# Patient Record
Sex: Female | Born: 2000 | Race: White | Hispanic: No | Marital: Single | Smoking: Former smoker
Health system: Southern US, Community
[De-identification: ages and names within clinical notes are randomized; demographics above are authoritative.]

## PROBLEM LIST (undated history)

## (undated) DIAGNOSIS — F329 Major depressive disorder, single episode, unspecified: Secondary | ICD-10-CM

## (undated) DIAGNOSIS — J020 Streptococcal pharyngitis: Secondary | ICD-10-CM

## (undated) DIAGNOSIS — F32A Depression, unspecified: Secondary | ICD-10-CM

## (undated) DIAGNOSIS — Z789 Other specified health status: Secondary | ICD-10-CM

## (undated) HISTORY — PX: WISDOM TOOTH EXTRACTION: SHX21

---
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
## 2018-05-05 ENCOUNTER — Emergency Department (HOSPITAL_COMMUNITY)
Admission: EM | Admit: 2018-05-05 | Discharge: 2018-05-05 | Disposition: A | Discharge status: Home / Self Care | Payer: Medicaid Other | Attending: Emergency Medicine | Admitting: Emergency Medicine

## 2018-05-05 ENCOUNTER — Encounter (HOSPITAL_COMMUNITY): Payer: Self-pay | Admitting: Emergency Medicine

## 2018-05-05 ENCOUNTER — Other Ambulatory Visit: Payer: Self-pay

## 2018-05-05 DIAGNOSIS — R509 Fever, unspecified: Secondary | ICD-10-CM | POA: Diagnosis present

## 2018-05-05 DIAGNOSIS — R110 Nausea: Secondary | ICD-10-CM | POA: Diagnosis not present

## 2018-05-05 DIAGNOSIS — R109 Unspecified abdominal pain: Secondary | ICD-10-CM | POA: Diagnosis not present

## 2018-05-05 DIAGNOSIS — Z5321 Procedure and treatment not carried out due to patient leaving prior to being seen by health care provider: Secondary | ICD-10-CM | POA: Diagnosis not present

## 2018-05-05 DIAGNOSIS — R51 Headache: Secondary | ICD-10-CM | POA: Insufficient documentation

## 2018-05-05 DIAGNOSIS — Z5329 Procedure and treatment not carried out because of patient's decision for other reasons: Secondary | ICD-10-CM

## 2018-05-05 DIAGNOSIS — Z5320 Procedure and treatment not carried out because of patient's decision for unspecified reasons: Secondary | ICD-10-CM

## 2018-05-05 HISTORY — DX: Depression, unspecified: F32.A

## 2018-05-05 HISTORY — DX: Major depressive disorder, single episode, unspecified: F32.9

## 2018-05-05 LAB — COMPREHENSIVE METABOLIC PANEL
ALT: 8 U/L (ref 0–44)
AST: 13 U/L — ABNORMAL LOW (ref 15–41)
Albumin: 4 g/dL (ref 3.5–5.0)
Alkaline Phosphatase: 50 U/L (ref 38–126)
Anion gap: 11 (ref 5–15)
BUN: <5 mg/dL — ABNORMAL LOW (ref 6–20)
CHLORIDE: 102 mmol/L (ref 98–111)
CO2: 25 mmol/L (ref 22–32)
CREATININE: 0.78 mg/dL (ref 0.44–1.00)
Calcium: 9.7 mg/dL (ref 8.9–10.3)
Glucose, Bld: 100 mg/dL — ABNORMAL HIGH (ref 70–99)
POTASSIUM: 3.8 mmol/L (ref 3.5–5.1)
SODIUM: 138 mmol/L (ref 135–145)
Total Bilirubin: 0.8 mg/dL (ref 0.3–1.2)
Total Protein: 7.5 g/dL (ref 6.5–8.1)

## 2018-05-05 LAB — URINALYSIS, ROUTINE W REFLEX MICROSCOPIC
BILIRUBIN URINE: NEGATIVE
GLUCOSE, UA: NEGATIVE mg/dL
Ketones, ur: 5 mg/dL — AB
LEUKOCYTES UA: NEGATIVE
NITRITE: NEGATIVE
PH: 6 (ref 5.0–8.0)
Protein, ur: NEGATIVE mg/dL
Specific Gravity, Urine: 1.017 (ref 1.005–1.030)

## 2018-05-05 LAB — CBC WITH DIFFERENTIAL/PLATELET
ABS IMMATURE GRANULOCYTES: 0 10*3/uL (ref 0.0–0.1)
Basophils Absolute: 0 10*3/uL (ref 0.0–0.1)
Basophils Relative: 0 %
EOS ABS: 0.1 10*3/uL (ref 0.0–0.7)
Eosinophils Relative: 1 %
HEMATOCRIT: 44.7 % (ref 36.0–46.0)
HEMOGLOBIN: 14.7 g/dL (ref 12.0–15.0)
IMMATURE GRANULOCYTES: 0 %
LYMPHS ABS: 1 10*3/uL (ref 0.7–4.0)
LYMPHS PCT: 9 %
MCH: 29.5 pg (ref 26.0–34.0)
MCHC: 32.9 g/dL (ref 30.0–36.0)
MCV: 89.8 fL (ref 78.0–100.0)
MONOS PCT: 7 %
Monocytes Absolute: 0.8 10*3/uL (ref 0.1–1.0)
NEUTROS ABS: 8.7 10*3/uL — AB (ref 1.7–7.7)
NEUTROS PCT: 83 %
Platelets: 283 10*3/uL (ref 150–400)
RBC: 4.98 MIL/uL (ref 3.87–5.11)
RDW: 12 % (ref 11.5–15.5)
WBC: 10.6 10*3/uL — AB (ref 4.0–10.5)

## 2018-05-05 LAB — I-STAT BETA HCG BLOOD, ED (MC, WL, AP ONLY): I-stat hCG, quantitative: <5 m[IU]/mL (ref <5)

## 2018-05-05 LAB — I-STAT CG4 LACTIC ACID, ED: LACTIC ACID, VENOUS: 0.89 mmol/L (ref 0.5–1.9)

## 2018-05-05 LAB — LIPASE, BLOOD: LIPASE: 30 U/L (ref 11–51)

## 2018-05-05 MED ORDER — ACETAMINOPHEN 500 MG PO TABS
1000.0000 mg | ORAL_TABLET | Freq: Once | ORAL | Status: AC
  Administered 2018-05-05: 1000 mg via ORAL
  Filled 2018-05-05: qty 2

## 2018-05-05 MED ORDER — IOPAMIDOL (ISOVUE-300) INJECTION 61%
INTRAVENOUS | Status: AC
  Filled 2018-05-05: qty 100

## 2018-05-05 NOTE — ED Triage Notes (Signed)
Patient reports fever, chills and right sided pain since Tuesday night. Took tylenol and Excedrin yesterday.  Lack of appetite. Denies any urinary symptoms.

## 2018-05-05 NOTE — ED Notes (Addendum)
Pt's mom called asking to speak to nursing supervisor.  Mom was transferred to this RN at [facility] and I spoke with her for approx 15 min..  Mom is concerned over wait for treatment room.  Explained wait and triage process.  Mom wanting lab results.  Explained that I cannot give her pt information without speaking with pt and that I can not give results to her or to patient that doctor or PA will have to give her results.  She is concerned that someone told her "everything was not ordered" when pt was initially seen.  Unable to locate pt in waiting room as she said earlier she was stepping outside.  Mom said she would text her to come talk to me.  Pt back to [facility] desk and is calm and appropriate.  Does not appear anxious.  She gave permission for me to talk to mom.  Informed mom that labs were ordered and completed and that pt is waiting on CT.  Mom wants to know how much longer for CT and treatment room.  Explained triage process multiple times and wait for treatment room.  Explained delay in CT due to Code Stroke and Trauma patients.  Mom keeps stating, "If y'all can't handle the volume you should send the patients somewhere else. Just tell me she needs to go somewhere else."  Explained to her that we cannot turn patients away and that we encouraged pt to stay to be seen.  She states that pt is extremely anxious.  Informed her that pt is in front of me and is in no distress at this time.  After getting off phone with patient's mother pt asked for her IV to be removed so she could go home.  Encouraged her again to stay and she declines.

## 2018-05-05 NOTE — ED Notes (Signed)
Pt wanting to leave.  Encouraged pt to stay to be seen and explained triage process.

## 2018-05-05 NOTE — ED Provider Notes (Cosign Needed)
Asked to reassess patient.  Last saw her an hour ago.  Her HR is down and fever is down with out tylenol (which was just given.)  She is waiting on CMP so she can have her CT scan.  Denies feeling any different or new complaints.    Cristina Gong, [place] 05/05/18 2030

## 2018-05-05 NOTE — ED Provider Notes (Cosign Needed)
Patient placed in Quick Look pathway, seen and evaluated   Chief Complaint: Fever  HPI:   Nausea with out vomiting.  Right side abdominal pain.  She has had fever with headache, denies any neck pain or stiffness.  The abdominal pain started today.  Denies any urinary symptoms.  Is never had any abdominal surgeries.   ROS: Fever, no cough  Physical Exam:   Gen: No distress  Neuro: Awake and Alert  Skin: Warm    Focused Exam: Throat no tonsillar exudates.  Abdomen is tender in the right upper and right lower quadrant.    Repeat heart rate will patient allowed to sit in the room heart rate down into the 80s / 90, no longer currently meeting sepsis criteria, therefore sepsis orders not placed.   Initiation of care has begun. The patient has been counseled on the process, plan, and necessity for staying for the completion/evaluation, and the remainder of the medical screening examination    Cristina Gong, Cordelia [place] 05/05/18 1927

## 2018-05-16 ENCOUNTER — Encounter (HOSPITAL_BASED_OUTPATIENT_CLINIC_OR_DEPARTMENT_OTHER): Payer: Self-pay

## 2018-09-06 ENCOUNTER — Other Ambulatory Visit: Payer: Self-pay

## 2018-09-06 ENCOUNTER — Emergency Department (HOSPITAL_BASED_OUTPATIENT_CLINIC_OR_DEPARTMENT_OTHER): Payer: Medicaid Other

## 2018-09-06 ENCOUNTER — Emergency Department (HOSPITAL_BASED_OUTPATIENT_CLINIC_OR_DEPARTMENT_OTHER)
Admission: EM | Admit: 2018-09-06 | Discharge: 2018-09-06 | Disposition: A | Discharge status: Home / Self Care | Payer: Medicaid Other | Attending: Emergency Medicine | Admitting: Emergency Medicine

## 2018-09-06 ENCOUNTER — Encounter (HOSPITAL_BASED_OUTPATIENT_CLINIC_OR_DEPARTMENT_OTHER): Payer: Self-pay

## 2018-09-06 DIAGNOSIS — R109 Unspecified abdominal pain: Secondary | ICD-10-CM | POA: Insufficient documentation

## 2018-09-06 DIAGNOSIS — R110 Nausea: Secondary | ICD-10-CM | POA: Diagnosis not present

## 2018-09-06 DIAGNOSIS — F17200 Nicotine dependence, unspecified, uncomplicated: Secondary | ICD-10-CM | POA: Diagnosis not present

## 2018-09-06 DIAGNOSIS — R1032 Left lower quadrant pain: Secondary | ICD-10-CM | POA: Diagnosis not present

## 2018-09-06 DIAGNOSIS — F1210 Cannabis abuse, uncomplicated: Secondary | ICD-10-CM | POA: Insufficient documentation

## 2018-09-06 LAB — COMPREHENSIVE METABOLIC PANEL
ALT: 12 U/L (ref 0–44)
AST: 16 U/L (ref 15–41)
Albumin: 4.8 g/dL (ref 3.5–5.0)
Alkaline Phosphatase: 53 U/L (ref 38–126)
Anion gap: 9 (ref 5–15)
BUN: 7 mg/dL (ref 6–20)
CHLORIDE: 103 mmol/L (ref 98–111)
CO2: 24 mmol/L (ref 22–32)
Calcium: 9.5 mg/dL (ref 8.9–10.3)
Creatinine, Ser: 0.8 mg/dL (ref 0.44–1.00)
Glucose, Bld: 108 mg/dL — ABNORMAL HIGH (ref 70–99)
POTASSIUM: 3.5 mmol/L (ref 3.5–5.1)
SODIUM: 136 mmol/L (ref 135–145)
Total Bilirubin: 1.2 mg/dL (ref 0.3–1.2)
Total Protein: 7.9 g/dL (ref 6.5–8.1)

## 2018-09-06 LAB — URINALYSIS, ROUTINE W REFLEX MICROSCOPIC
Bilirubin Urine: NEGATIVE
Glucose, UA: NEGATIVE mg/dL
Ketones, ur: 15 mg/dL — AB
Nitrite: NEGATIVE
Protein, ur: 30 mg/dL — AB
Specific Gravity, Urine: 1.02 (ref 1.005–1.030)
pH: 6 (ref 5.0–8.0)

## 2018-09-06 LAB — URINALYSIS, MICROSCOPIC (REFLEX)

## 2018-09-06 LAB — CBC
HCT: 46 % (ref 36.0–46.0)
Hemoglobin: 14.8 g/dL (ref 12.0–15.0)
MCH: 29.2 pg (ref 26.0–34.0)
MCHC: 32.2 g/dL (ref 30.0–36.0)
MCV: 90.9 fL (ref 80.0–100.0)
NRBC: 0 % (ref 0.0–0.2)
PLATELETS: 303 10*3/uL (ref 150–400)
RBC: 5.06 MIL/uL (ref 3.87–5.11)
RDW: 13 % (ref 11.5–15.5)
WBC: 12.2 10*3/uL — ABNORMAL HIGH (ref 4.0–10.5)

## 2018-09-06 LAB — PREGNANCY, URINE: PREG TEST UR: NEGATIVE

## 2018-09-06 MED ORDER — KETOROLAC TROMETHAMINE 30 MG/ML IJ SOLN
30.0000 mg | Freq: Once | INTRAMUSCULAR | Status: AC
  Administered 2018-09-06: 30 mg via INTRAVENOUS
  Filled 2018-09-06: qty 1

## 2018-09-06 MED ORDER — ONDANSETRON HCL 4 MG/2ML IJ SOLN
4.0000 mg | Freq: Once | INTRAMUSCULAR | Status: AC
  Administered 2018-09-06: 4 mg via INTRAVENOUS
  Filled 2018-09-06: qty 2

## 2018-09-06 MED ORDER — CIPROFLOXACIN HCL 500 MG PO TABS: 500.0000 mg | ORAL_TABLET | Freq: Two times a day (BID) | ORAL | 0 refills | Status: DC

## 2018-09-06 MED ORDER — SODIUM CHLORIDE 0.9 % IV BOLUS
500.0000 mL | Freq: Once | INTRAVENOUS | Status: AC
  Administered 2018-09-06: 500 mL via INTRAVENOUS

## 2018-09-06 MED ORDER — OXYCODONE-ACETAMINOPHEN 5-325 MG PO TABS
1.0000 | ORAL_TABLET | Freq: Once | ORAL | Status: AC
  Administered 2018-09-06: 1 via ORAL
  Filled 2018-09-06: qty 1

## 2018-09-06 MED ORDER — CIPROFLOXACIN HCL 500 MG PO TABS
500.0000 mg | ORAL_TABLET | Freq: Once | ORAL | Status: AC
  Administered 2018-09-06: 500 mg via ORAL
  Filled 2018-09-06: qty 1

## 2018-09-06 MED ORDER — ONDANSETRON 4 MG PO TBDP: 4.0000 mg | ORAL_TABLET | Freq: Three times a day (TID) | ORAL | 0 refills | Status: DC | PRN

## 2018-09-06 MED ORDER — METRONIDAZOLE 500 MG PO TABS: 500.0000 mg | ORAL_TABLET | Freq: Three times a day (TID) | ORAL | 0 refills | Status: DC

## 2018-09-06 MED ORDER — MORPHINE SULFATE (PF) 4 MG/ML IV SOLN
4.0000 mg | Freq: Once | INTRAVENOUS | Status: AC
  Administered 2018-09-06: 4 mg via INTRAVENOUS
  Filled 2018-09-06: qty 1

## 2018-09-06 MED ORDER — NAPROXEN 500 MG PO TABS: 500.0000 mg | ORAL_TABLET | Freq: Two times a day (BID) | ORAL | 0 refills | Status: DC

## 2018-09-06 MED ORDER — METRONIDAZOLE IN NACL 5-0.79 MG/ML-% IV SOLN
500.0000 mg | Freq: Once | INTRAVENOUS | Status: AC
  Administered 2018-09-06: 500 mg via INTRAVENOUS
  Filled 2018-09-06: qty 100

## 2018-09-06 MED ORDER — SODIUM CHLORIDE 0.9 % IV SOLN
INTRAVENOUS | Status: DC | PRN
  Administered 2018-09-06: 14:00:00 via INTRAVENOUS

## 2018-09-06 MED ORDER — CIPROFLOXACIN IN D5W 400 MG/200ML IV SOLN
400.0000 mg | Freq: Once | INTRAVENOUS | Status: DC
  Filled 2018-09-06: qty 200

## 2018-09-06 MED ORDER — OXYCODONE-ACETAMINOPHEN 5-325 MG PO TABS: 1.0000 | ORAL_TABLET | Freq: Four times a day (QID) | ORAL | 0 refills | Status: DC | PRN

## 2018-09-06 MED ORDER — METRONIDAZOLE IN NACL 5-0.79 MG/ML-% IV SOLN
INTRAVENOUS | Status: AC
  Administered 2018-09-06: 500 mg via INTRAVENOUS
  Filled 2018-09-06: qty 100

## 2018-09-06 NOTE — ED Triage Notes (Signed)
C/o LLQ pain, nausea started last night-NAD-steady gait

## 2018-09-06 NOTE — ED Provider Notes (Signed)
[facility] EMERGENCY DEPARTMENT Provider Note   CSN: [id number] Arrival date & time: 09/06/18  1130     History   Chief Complaint Chief Complaint  Patient presents with  . Abdominal Pain    HPI Danielle Ortiz is a 18 y.o. female with a hx of tobacco abuse and depression who presents to the ED with complaints of L flank pain that woke her from sleep last night. States pain is located in the L flank and somewhat radiates to the LLQ. Pain waxes/wanes and is a 6/10 in severity, worse with "everything" no alleviating factors. Tried aspirin without relief. Has had some associated nausea. Denies fever, chills, vomiting, dysuria, frequency, urgency, hematuria, vaginal bleeding, or vaginal discharge. She is sexually active in a monogamous relationship and is not concerned for STDs.   HPI  Past Medical History:  Diagnosis Date  . Depression   . Medical history non-contributory   . Strep throat     Patient Active Problem List   Diagnosis Date Noted  . Occipital neuralgia of right side 10/14/2016    Past Surgical History:  Procedure Laterality Date  . WISDOM TOOTH EXTRACTION       OB History    Gravida  1   Para  0   Term  0   Preterm  0   AB  0   Living        SAB  0   TAB  0   Ectopic  0   Multiple      Live Births               Home Medications    Prior to Admission medications   Medication Sig Start Date End Date Taking? Authorizing Provider  ADDERALL XR 10 MG 24 hr capsule Take 10 mg by mouth daily. 04/29/18   [provider]  aspirin-acetaminophen-caffeine (EXCEDRIN MIGRAINE) 6360082725250-250-65 MG tablet Take 2 tablets by mouth every 6 (six) hours as needed for headache.    [provider]  FLUoxetine (PROZAC) 10 MG capsule Take 10 mg by mouth daily. 04/28/18   [provider]    Family History Family History  Problem Relation Age of Onset  . Migraines Mother   . Migraines Maternal Aunt   . Migraines  Maternal Grandmother   . Depression Maternal Grandmother   . Seizures Neg Hx   . Anxiety disorder Neg Hx   . Bipolar disorder Neg Hx   . Schizophrenia Neg Hx   . ADD / ADHD Neg Hx   . Autism Neg Hx     Social History Social History   Tobacco Use  . Smoking status: Current Every Day Smoker  . Smokeless tobacco: Never Used  Substance Use Topics  . Alcohol use: Yes    Comment: occ  . Drug use: Yes    Types: Marijuana     Allergies   Patient has no known allergies.   Review of Systems Review of Systems  Constitutional: Negative for chills and fever.  Respiratory: Negative for shortness of breath.   Cardiovascular: Negative for chest pain.  Gastrointestinal: Positive for abdominal pain and nausea. Negative for blood in stool, constipation, diarrhea and vomiting.  Genitourinary: Positive for flank pain. Negative for decreased urine volume, dysuria, hematuria, pelvic pain, urgency, vaginal bleeding and vaginal discharge.  All other systems reviewed and are negative.    Physical Exam Updated Vital Signs BP 112/74 (BP Location: Left Arm)   Pulse 87   Temp 98.8  F (37.1 C) (Oral)   Resp 18   Ht 5\' 3"  (1.6 m)   Wt 56.8 kg   SpO2 100%   BMI 22.20 kg/m   Physical Exam Vitals signs and nursing note reviewed.  Constitutional:      General: She is not in acute distress.    Appearance: She is well-developed. She is not toxic-appearing.  HENT:     Head: Normocephalic and atraumatic.  Eyes:     General:        Right eye: No discharge.        Left eye: No discharge.     Conjunctiva/sclera: Conjunctivae normal.  Neck:     Musculoskeletal: Neck supple.  Cardiovascular:     Rate and Rhythm: Normal rate and regular rhythm.  Pulmonary:     Effort: Pulmonary effort is normal. No respiratory distress.     Breath sounds: Normal breath sounds. No wheezing, rhonchi or rales.  Abdominal:     General: There is no distension.     Palpations: Abdomen is soft.     Tenderness:  There is no abdominal tenderness. There is left CVA tenderness. There is no guarding or rebound. Negative signs include Murphy's sign, Rovsing's sign and McBurney's sign.  Skin:    General: Skin is warm and dry.     Findings: No rash.  Neurological:     Mental Status: She is alert.     Comments: Clear speech.   Psychiatric:        Behavior: Behavior normal.      ED Treatments / Results  Labs (all labs ordered are listed, but only abnormal results are displayed) Labs Reviewed  URINALYSIS, ROUTINE W REFLEX MICROSCOPIC - Abnormal; Notable for the following components:      Result Value   Hgb urine dipstick SMALL (*)    Ketones, ur 15 (*)    Protein, ur 30 (*)    Leukocytes, UA SMALL (*)    All other components within normal limits  CBC - Abnormal; Notable for the following components:   WBC 12.2 (*)    All other components within normal limits  COMPREHENSIVE METABOLIC PANEL - Abnormal; Notable for the following components:   Glucose, Bld 108 (*)    All other components within normal limits  URINALYSIS, MICROSCOPIC (REFLEX) - Abnormal; Notable for the following components:   Bacteria, UA MANY (*)    All other components within normal limits  URINE CULTURE  PREGNANCY, URINE    EKG None  Radiology Ct Renal Stone Study  Result Date: 09/06/2018 CLINICAL DATA:  Left lower quadrant abdominal pain and nausea since last night. EXAM: CT ABDOMEN AND PELVIS WITHOUT CONTRAST TECHNIQUE: Multidetector CT imaging of the abdomen and pelvis was performed following the standard protocol without IV contrast. COMPARISON:  None. FINDINGS: Lower chest: Clear lung bases. No significant pleural or pericardial effusion. Hepatobiliary: The liver appears unremarkable as imaged in the noncontrast state. No evidence of gallstones, gallbladder wall thickening or biliary dilatation. Pancreas: Unremarkable. No pancreatic ductal dilatation or surrounding inflammatory changes. Spleen: Normal in size without  focal abnormality. Adrenals/Urinary Tract: Both adrenal glands appear normal. There is mildly increased density in the region of the renal pyramids bilaterally, but no discrete renal, ureteral or bladder calculi. There is no hydronephrosis or perinephric soft tissue stranding. The bladder appears unremarkable for its degree of distention. Stomach/Bowel: No evidence of bowel wall thickening or distention. Probable visualization of a normal appearing appendix. There is questionable mild inflammatory changes surrounding the  descending colon. No extraluminal fluid collection. Vascular/Lymphatic: There are no enlarged abdominal or pelvic lymph nodes. No significant vascular findings on noncontrast imaging. Reproductive: The uterus and ovaries appear unremarkable. No adnexal mass. Other: No evidence of abdominal wall mass or hernia. No ascites or free air. Musculoskeletal: No acute or significant osseous findings. IMPRESSION: 1. No evidence of urinary tract calculus or hydronephrosis. 2. Questionable mild soft tissue stranding adjacent to the descending colon. This could reflect mild diverticulitis or appendagitis epiploica. No appreciable bowel wall thickening, bowel obstruction or appendiceal abnormality. Electronically Signed   By: Carey Bullocks M.D.   On: 09/06/2018 12:51    Procedures Procedures (including critical care time)  Medications Ordered in ED Medications  ondansetron (ZOFRAN) injection 4 mg (has no administration in time range)  morphine 4 MG/ML injection 4 mg (has no administration in time range)  sodium chloride 0.9 % bolus 500 mL (has no administration in time range)     Initial Impression / Assessment and Plan / ED Course  I have reviewed the triage vital signs and the nursing notes.  Pertinent labs & imaging results that were available during my care of the patient were reviewed by me and considered in my medical decision making (see chart for details).   Patient presents to the ED  with L flank pain. Nontoxic appearing, vitals WNL. Exam with L CVA tenderness. Abdomen nontender without peritoneal signs. Labs. CT renal study. Pain control, anti-emetics, fluids.   Labs reviewed: Nonspecific leukocytosis at 12.2. No anemia. No electrolyte derangement. LFTs WNL. Renal function preserved.   UA concerning for infection- possibly pyelo, CT renal study without nephrolithiasis, however also with questionable mild soft tissue stranding adjacent to the descending colon. Per radiology read this could reflect mild diverticulitis or appendagitis epiploica. No appreciable bowel wall thickening, bowel obstruction or appendiceal abnormality.   Exam remains with nontender abdomen, no peritoneal signs- doubt bowel obstruction/perforation, appendicitis, cholecystitis, pancreatitis. Additionally doubt ovarian torsion, PID or ectopic pregnancy- u preg negative, no concern for STD, no pelvic tenderness externally. Patient tolerating PO and feeling much better. Discussed findings and plan of care with supervising physician Dr. Dalene Seltzer in agreement for plan for coverage w/ cipro/flagyl which would cover for pyelo and or diverticulitis. Urine culture ordered. Zofran for nausea and naproxen/percocoet for pain, [employer] Controlled Substance reporting System queried. PCP follow up. I discussed results, treatment plan(including no EtOH w/ flagyl), need for follow-up, and return precautions with the patient & her mother at bedside. Provided opportunity for questions, patient & her mother confirmed understanding and are in agreement with plan.    Final Clinical Impressions(s) / ED Diagnoses   Final diagnoses:  Flank pain    ED Discharge Orders         Ordered    oxyCODONE-acetaminophen (PERCOCET/ROXICET) 5-325 MG tablet  Every 6 hours PRN     09/06/18 1428    metroNIDAZOLE (FLAGYL) 500 MG tablet  3 times daily     09/06/18 1428    ciprofloxacin (CIPRO) 500 MG tablet  2 times daily     09/06/18  1428    ondansetron (ZOFRAN ODT) 4 MG disintegrating tablet  Every 8 hours PRN     09/06/18 1428    naproxen (NAPROSYN) 500 MG tablet  2 times daily     09/06/18 [address] [place] R, PA-C 09/06/18 1453    Alvira Monday, MD 09/08/18 [phone]

## 2018-09-06 NOTE — Discharge Instructions (Addendum)
You were seen in the ER today for backslash abdominal pain.  Your work-up in the ER was somewhat inconclusive, your CT scan and urine do show some findings concerning for possibly a kidney infection and/or an infection of the intestine otherwise known as diverticulitis.  Send you home with multiple medicines to help with your conditions:  -Ciprofloxacin and Flagyl: These are antibiotics.  Please take these as prescribed.  Do not drink alcohol with Flagyl as it can have extremely severe side effects. -Zofran: This is an antinausea medication you may take this every 8 hours as needed for nausea and vomiting. -Naproxen is a nonsteroidal anti-inflammatory medication that will help with pain and swelling. Be sure to take this medication as prescribed with food, 1 pill every 12 hours,  It should be taken with food, as it can cause stomach upset, and more seriously, stomach bleeding. Do not take other nonsteroidal anti-inflammatory medications with this such as Advil, Motrin, Aleve, Mobic, Goodie Powder, or Motrin.   - Percocet-this is a narcotic/controlled substance medication that has potential addicting qualities.  We recommend that you take 1-2 tablets every 6 hours as needed for severe pain.  Do not drive or operate heavy machinery when taking this medicine as it can be sedating. Do not drink alcohol or take other sedating medications when taking this medicine for safety reasons.  Keep this out of reach of small children.  Please be aware this medicine has Tylenol in it (325 mg/tab) do not exceed the maximum dose of Tylenol in a day per over the counter recommendations should you decide to supplement with Tylenol over the counter.   We have prescribed you new medication(s) today. Discuss the medications prescribed today with your pharmacist as they can have adverse effects and interactions with your other medicines including over the counter and prescribed medications. Seek medical evaluation if you start to  experience new or abnormal symptoms after taking one of these medicines, seek care immediately if you start to experience difficulty breathing, feeling of your throat closing, facial swelling, or rash as these could be indications of a more serious allergic reaction  We would like you to follow-up closely with your primary care provider, if you do not have a primary care provider please see the [facility] or call the phone number circled in your discharge instructions.  Please follow-up in 3 to 5 days.  Return to the ER for new or worsening symptoms including but not limited to worsening pain, inability to keep fluids down, blood in your stool, fevers, or any other concerns.

## 2018-09-07 ENCOUNTER — Observation Stay (HOSPITAL_COMMUNITY): Payer: Medicaid Other

## 2018-09-07 ENCOUNTER — Other Ambulatory Visit: Payer: Self-pay

## 2018-09-07 ENCOUNTER — Emergency Department (HOSPITAL_BASED_OUTPATIENT_CLINIC_OR_DEPARTMENT_OTHER): Payer: Medicaid Other

## 2018-09-07 ENCOUNTER — Observation Stay (HOSPITAL_BASED_OUTPATIENT_CLINIC_OR_DEPARTMENT_OTHER)
Admission: EM | Admit: 2018-09-07 | Discharge: 2018-09-09 | Disposition: A | Discharge status: Home / Self Care | Payer: Medicaid Other | Attending: Internal Medicine | Admitting: Internal Medicine

## 2018-09-07 ENCOUNTER — Encounter (HOSPITAL_BASED_OUTPATIENT_CLINIC_OR_DEPARTMENT_OTHER): Payer: Self-pay | Admitting: *Deleted

## 2018-09-07 DIAGNOSIS — Z791 Long term (current) use of non-steroidal anti-inflammatories (NSAID): Secondary | ICD-10-CM | POA: Diagnosis not present

## 2018-09-07 DIAGNOSIS — R509 Fever, unspecified: Secondary | ICD-10-CM

## 2018-09-07 DIAGNOSIS — F329 Major depressive disorder, single episode, unspecified: Secondary | ICD-10-CM | POA: Diagnosis not present

## 2018-09-07 DIAGNOSIS — R109 Unspecified abdominal pain: Secondary | ICD-10-CM | POA: Diagnosis present

## 2018-09-07 DIAGNOSIS — N390 Urinary tract infection, site not specified: Secondary | ICD-10-CM | POA: Diagnosis present

## 2018-09-07 DIAGNOSIS — E876 Hypokalemia: Secondary | ICD-10-CM

## 2018-09-07 DIAGNOSIS — N10 Acute tubulo-interstitial nephritis: Principal | ICD-10-CM | POA: Diagnosis present

## 2018-09-07 DIAGNOSIS — R112 Nausea with vomiting, unspecified: Secondary | ICD-10-CM | POA: Insufficient documentation

## 2018-09-07 DIAGNOSIS — F17200 Nicotine dependence, unspecified, uncomplicated: Secondary | ICD-10-CM | POA: Insufficient documentation

## 2018-09-07 DIAGNOSIS — Z79899 Other long term (current) drug therapy: Secondary | ICD-10-CM | POA: Insufficient documentation

## 2018-09-07 DIAGNOSIS — Z789 Other specified health status: Secondary | ICD-10-CM

## 2018-09-07 DIAGNOSIS — B9620 Unspecified Escherichia coli [E. coli] as the cause of diseases classified elsewhere: Secondary | ICD-10-CM

## 2018-09-07 DIAGNOSIS — E860 Dehydration: Secondary | ICD-10-CM | POA: Diagnosis present

## 2018-09-07 DIAGNOSIS — R1110 Vomiting, unspecified: Secondary | ICD-10-CM | POA: Diagnosis present

## 2018-09-07 DIAGNOSIS — N12 Tubulo-interstitial nephritis, not specified as acute or chronic: Secondary | ICD-10-CM

## 2018-09-07 LAB — URINALYSIS, ROUTINE W REFLEX MICROSCOPIC
Bilirubin Urine: NEGATIVE
Glucose, UA: NEGATIVE mg/dL
Ketones, ur: NEGATIVE mg/dL
Leukocytes, UA: NEGATIVE
NITRITE: NEGATIVE
Protein, ur: 30 mg/dL — AB
Specific Gravity, Urine: 1.025 (ref 1.005–1.030)
pH: 6 (ref 5.0–8.0)

## 2018-09-07 LAB — CBC WITH DIFFERENTIAL/PLATELET
Abs Immature Granulocytes: 0.06 10*3/uL (ref 0.00–0.07)
BASOS PCT: 0 %
Basophils Absolute: 0 10*3/uL (ref 0.0–0.1)
Eosinophils Absolute: 0 10*3/uL (ref 0.0–0.5)
Eosinophils Relative: 0 %
HCT: 40.4 % (ref 36.0–46.0)
Hemoglobin: 13.1 g/dL (ref 12.0–15.0)
Immature Granulocytes: 1 %
Lymphocytes Relative: 6 %
Lymphs Abs: 0.7 10*3/uL (ref 0.7–4.0)
MCH: 29.8 pg (ref 26.0–34.0)
MCHC: 32.4 g/dL (ref 30.0–36.0)
MCV: 91.8 fL (ref 80.0–100.0)
Monocytes Absolute: 1.5 10*3/uL — ABNORMAL HIGH (ref 0.1–1.0)
Monocytes Relative: 12 %
NRBC: 0 % (ref 0.0–0.2)
Neutro Abs: 10.5 10*3/uL — ABNORMAL HIGH (ref 1.7–7.7)
Neutrophils Relative %: 81 %
Platelets: 226 10*3/uL (ref 150–400)
RBC: 4.4 MIL/uL (ref 3.87–5.11)
RDW: 12.6 % (ref 11.5–15.5)
WBC: 12.8 10*3/uL — ABNORMAL HIGH (ref 4.0–10.5)

## 2018-09-07 LAB — URINALYSIS, MICROSCOPIC (REFLEX)

## 2018-09-07 LAB — COMPREHENSIVE METABOLIC PANEL
ALT: 10 U/L (ref 0–44)
AST: 12 U/L — ABNORMAL LOW (ref 15–41)
Albumin: 4 g/dL (ref 3.5–5.0)
Alkaline Phosphatase: 44 U/L (ref 38–126)
Anion gap: 6 (ref 5–15)
BUN: 8 mg/dL (ref 6–20)
CO2: 23 mmol/L (ref 22–32)
Calcium: 8.7 mg/dL — ABNORMAL LOW (ref 8.9–10.3)
Chloride: 107 mmol/L (ref 98–111)
Creatinine, Ser: 0.94 mg/dL (ref 0.44–1.00)
GFR calc Af Amer: >60 mL/min (ref >60)
GFR calc non Af Amer: >60 mL/min (ref >60)
Glucose, Bld: 100 mg/dL — ABNORMAL HIGH (ref 70–99)
POTASSIUM: 3.6 mmol/L (ref 3.5–5.1)
Sodium: 136 mmol/L (ref 135–145)
Total Bilirubin: 0.7 mg/dL (ref 0.3–1.2)
Total Protein: 6.6 g/dL (ref 6.5–8.1)

## 2018-09-07 LAB — MAGNESIUM: MAGNESIUM: 1.6 mg/dL — AB (ref 1.7–2.4)

## 2018-09-07 LAB — PREGNANCY, URINE: Preg Test, Ur: NEGATIVE

## 2018-09-07 LAB — LIPASE, BLOOD: Lipase: 22 U/L (ref 11–51)

## 2018-09-07 MED ORDER — IOPAMIDOL (ISOVUE-300) INJECTION 61%
100.0000 mL | Freq: Once | INTRAVENOUS | Status: AC | PRN
  Administered 2018-09-07: 100 mL via INTRAVENOUS

## 2018-09-07 MED ORDER — SODIUM CHLORIDE 0.9 % IV BOLUS
1000.0000 mL | Freq: Once | INTRAVENOUS | Status: AC
  Administered 2018-09-07: 1000 mL via INTRAVENOUS

## 2018-09-07 MED ORDER — ONDANSETRON HCL 4 MG/2ML IJ SOLN
4.0000 mg | Freq: Once | INTRAMUSCULAR | Status: AC
  Administered 2018-09-07: 4 mg via INTRAVENOUS
  Filled 2018-09-07: qty 2

## 2018-09-07 MED ORDER — HYDROMORPHONE HCL 1 MG/ML IJ SOLN
1.0000 mg | Freq: Once | INTRAMUSCULAR | Status: AC
  Administered 2018-09-07: 1 mg via INTRAVENOUS
  Filled 2018-09-07: qty 1

## 2018-09-07 MED ORDER — POLYETHYLENE GLYCOL 3350 17 G PO PACK: 17.0000 g | PACK | Freq: Every day | ORAL | Status: DC | PRN

## 2018-09-07 MED ORDER — SORBITOL 70 % SOLN
30.0000 mL | Freq: Every day | Status: DC | PRN
  Filled 2018-09-07: qty 30

## 2018-09-07 MED ORDER — SODIUM CHLORIDE 0.9% FLUSH
3.0000 mL | Freq: Two times a day (BID) | INTRAVENOUS | Status: DC
  Administered 2018-09-08: 3 mL via INTRAVENOUS

## 2018-09-07 MED ORDER — HYDROMORPHONE HCL 1 MG/ML IJ SOLN: 1.0000 mg | INTRAMUSCULAR | Status: DC | PRN

## 2018-09-07 MED ORDER — HYDROMORPHONE HCL 1 MG/ML IJ SOLN
1.0000 mg | INTRAMUSCULAR | Status: DC | PRN
  Administered 2018-09-07: 1 mg via INTRAVENOUS
  Filled 2018-09-07: qty 1

## 2018-09-07 MED ORDER — SODIUM CHLORIDE 0.9 % IV SOLN
2.0000 g | Freq: Once | INTRAVENOUS | Status: AC
  Administered 2018-09-07: 2 g via INTRAVENOUS
  Filled 2018-09-07: qty 20

## 2018-09-07 MED ORDER — ENOXAPARIN SODIUM 40 MG/0.4ML ~~LOC~~ SOLN
40.0000 mg | SUBCUTANEOUS | Status: DC
  Filled 2018-09-07 (×2): qty 0.4

## 2018-09-07 MED ORDER — KETOROLAC TROMETHAMINE 30 MG/ML IJ SOLN
30.0000 mg | Freq: Four times a day (QID) | INTRAMUSCULAR | Status: DC | PRN
  Administered 2018-09-07 – 2018-09-08 (×2): 30 mg via INTRAVENOUS
  Filled 2018-09-07 (×2): qty 1

## 2018-09-07 MED ORDER — ONDANSETRON HCL 4 MG/2ML IJ SOLN
4.0000 mg | Freq: Four times a day (QID) | INTRAMUSCULAR | Status: DC
  Administered 2018-09-08 (×3): 4 mg via INTRAVENOUS
  Filled 2018-09-07 (×3): qty 2

## 2018-09-07 MED ORDER — SODIUM CHLORIDE 0.9 % IV SOLN
INTRAVENOUS | Status: DC
  Administered 2018-09-07 – 2018-09-08 (×4): via INTRAVENOUS

## 2018-09-07 MED ORDER — MORPHINE SULFATE (PF) 4 MG/ML IV SOLN
4.0000 mg | Freq: Once | INTRAVENOUS | Status: AC
  Administered 2018-09-07: 4 mg via INTRAVENOUS
  Filled 2018-09-07: qty 1

## 2018-09-07 MED ORDER — ONDANSETRON HCL 4 MG/2ML IJ SOLN
4.0000 mg | Freq: Four times a day (QID) | INTRAMUSCULAR | Status: DC | PRN
  Administered 2018-09-07: 4 mg via INTRAVENOUS
  Filled 2018-09-07: qty 2

## 2018-09-07 MED ORDER — FLUOXETINE HCL 10 MG PO CAPS
10.0000 mg | ORAL_CAPSULE | Freq: Every day | ORAL | Status: DC
  Administered 2018-09-09: 10 mg via ORAL
  Filled 2018-09-07 (×2): qty 1

## 2018-09-07 MED ORDER — ACETAMINOPHEN 650 MG RE SUPP: 650.0000 mg | Freq: Four times a day (QID) | RECTAL | Status: DC | PRN

## 2018-09-07 MED ORDER — PROCHLORPERAZINE EDISYLATE 10 MG/2ML IJ SOLN
10.0000 mg | Freq: Four times a day (QID) | INTRAMUSCULAR | Status: DC | PRN
  Administered 2018-09-07 – 2018-09-09 (×3): 10 mg via INTRAVENOUS
  Filled 2018-09-07 (×4): qty 2

## 2018-09-07 MED ORDER — ALBUTEROL SULFATE (2.5 MG/3ML) 0.083% IN NEBU: 2.5000 mg | INHALATION_SOLUTION | RESPIRATORY_TRACT | Status: DC | PRN

## 2018-09-07 MED ORDER — SODIUM CHLORIDE 0.9 % IV SOLN
1.0000 g | INTRAVENOUS | Status: DC
  Filled 2018-09-07: qty 10

## 2018-09-07 MED ORDER — OXYCODONE HCL 5 MG PO TABS
5.0000 mg | ORAL_TABLET | ORAL | Status: DC | PRN
  Administered 2018-09-07 – 2018-09-08 (×2): 5 mg via ORAL
  Filled 2018-09-07 (×2): qty 1

## 2018-09-07 MED ORDER — HYDROMORPHONE HCL 1 MG/ML IJ SOLN
1.0000 mg | INTRAMUSCULAR | Status: DC | PRN
  Administered 2018-09-08 (×3): 1 mg via INTRAVENOUS
  Filled 2018-09-07 (×3): qty 1

## 2018-09-07 MED ORDER — ACETAMINOPHEN 325 MG PO TABS
650.0000 mg | ORAL_TABLET | Freq: Four times a day (QID) | ORAL | Status: DC | PRN
  Administered 2018-09-07: 650 mg via ORAL
  Filled 2018-09-07: qty 2

## 2018-09-07 MED ORDER — MAGNESIUM CITRATE PO SOLN: 1.0000 | Freq: Once | ORAL | Status: DC | PRN

## 2018-09-07 MED ORDER — OXYCODONE-ACETAMINOPHEN 5-325 MG PO TABS
1.0000 | ORAL_TABLET | Freq: Once | ORAL | Status: AC
  Administered 2018-09-07: 1 via ORAL
  Filled 2018-09-07: qty 1

## 2018-09-07 MED ORDER — IOPAMIDOL (ISOVUE-300) INJECTION 61%
30.0000 mL | Freq: Once | INTRAVENOUS | Status: AC | PRN
  Administered 2018-09-07: 30 mL via ORAL

## 2018-09-07 NOTE — ED Notes (Addendum)
Pt c/o lower back pain, notified dr. Rubin Payor.  Iv in place

## 2018-09-07 NOTE — ED Triage Notes (Signed)
Pt seen here yesterday for same thing, A CT scan was performed and she cant keep her meds down and the pain has gotten worse.

## 2018-09-07 NOTE — ED Provider Notes (Signed)
[facility] EMERGENCY DEPARTMENT Provider Note   CSN: [id number] Arrival date & time: 09/07/18  0856     History   Chief Complaint Chief Complaint  Patient presents with  . Flank Pain    HPI Danielle Ortiz is a 18 y.o. female.  Pt presents to the ED today with LLQ pain and n/v.  The pt was here yesterday for left flank pain, had a ct renal which showed possible inflammation in the LLQ.  She was d/c home with cipro, flagyl, zofran, and percocet.  She said she's worse today.  She has been unable to keep down any fluids.  She denies f/c.     Past Medical History:  Diagnosis Date  . Depression   . Medical history non-contributory   . Strep throat     Patient Active Problem List   Diagnosis Date Noted  . Acute pyelonephritis 09/07/2018  . Occipital neuralgia of right side 10/14/2016    Past Surgical History:  Procedure Laterality Date  . WISDOM TOOTH EXTRACTION       OB History    Gravida  1   Para  0   Term  0   Preterm  0   AB  0   Living        SAB  0   TAB  0   Ectopic  0   Multiple      Live Births               Home Medications    Prior to Admission medications   Medication Sig Start Date End Date Taking? Authorizing Provider  ADDERALL XR 10 MG 24 hr capsule Take 10 mg by mouth daily. 04/29/18   [provider]  aspirin-acetaminophen-caffeine (EXCEDRIN MIGRAINE) [id number] MG tablet Take 2 tablets by mouth every 6 (six) hours as needed for headache.    [provider]  ciprofloxacin (CIPRO) 500 MG tablet Take 1 tablet (500 mg total) by mouth 2 (two) times daily. 09/06/18   Petrucelli, Samantha R, PA-C  FLUoxetine (PROZAC) 10 MG capsule Take 10 mg by mouth daily. 04/28/18   [provider]  metroNIDAZOLE (FLAGYL) 500 MG tablet Take 1 tablet (500 mg total) by mouth 3 (three) times daily. 09/06/18   Petrucelli, Samantha R, PA-C  naproxen (NAPROSYN) 500 MG tablet Take 1 tablet (500 mg total) by  mouth 2 (two) times daily. 09/06/18   Petrucelli, Samantha R, PA-C  ondansetron (ZOFRAN ODT) 4 MG disintegrating tablet Take 1 tablet (4 mg total) by mouth every 8 (eight) hours as needed for nausea or vomiting. 09/06/18   Petrucelli, Pleas KochSamantha R, PA-C  oxyCODONE-acetaminophen (PERCOCET/ROXICET) 5-325 MG tablet Take 1-2 tablets by mouth every 6 (six) hours as needed for severe pain. 09/06/18   Petrucelli, Pleas KochSamantha R, PA-C    Family History Family History  Problem Relation Age of Onset  . Migraines Mother   . Migraines Maternal Aunt   . Migraines Maternal Grandmother   . Depression Maternal Grandmother   . Seizures Neg Hx   . Anxiety disorder Neg Hx   . Bipolar disorder Neg Hx   . Schizophrenia Neg Hx   . ADD / ADHD Neg Hx   . Autism Neg Hx     Social History Social History   Tobacco Use  . Smoking status: Current Every Day Smoker  . Smokeless tobacco: Never Used  Substance Use Topics  . Alcohol use: Yes    Comment: occ  . Drug use: Yes  Types: Marijuana     Allergies   Patient has no known allergies.   Review of Systems Review of Systems  Gastrointestinal: Positive for abdominal pain, nausea and vomiting.  All other systems reviewed and are negative.    Physical Exam Updated Vital Signs BP (!) 103/58 (BP Location: Right Arm)   Pulse 84   Temp 99 F (37.2 C) (Oral)   Resp 18   Ht 5\' 3"  (1.6 m)   Wt 56.8 kg   SpO2 100%   BMI 22.20 kg/m   Physical Exam Vitals signs and nursing note reviewed.  Constitutional:      Appearance: Normal appearance. She is normal weight.  HENT:     Head: Normocephalic and atraumatic.     Right Ear: External ear normal.     Left Ear: External ear normal.     Nose: Nose normal.     Mouth/Throat:     Mouth: Mucous membranes are dry.     Pharynx: Oropharynx is clear.  Eyes:     Extraocular Movements: Extraocular movements intact.     Conjunctiva/sclera: Conjunctivae normal.     Pupils: Pupils are equal, round, and reactive to  light.  Neck:     Musculoskeletal: Normal range of motion and neck supple.  Cardiovascular:     Rate and Rhythm: Normal rate and regular rhythm.     Pulses: Normal pulses.     Heart sounds: Normal heart sounds.  Pulmonary:     Effort: Pulmonary effort is normal.  Abdominal:     General: Abdomen is flat. Bowel sounds are normal.     Palpations: Abdomen is soft.     Tenderness: There is abdominal tenderness in the left lower quadrant.  Musculoskeletal: Normal range of motion.  Skin:    General: Skin is warm and dry.     Capillary Refill: Capillary refill takes less than 2 seconds.  Neurological:     General: No focal deficit present.     Mental Status: She is alert and oriented to person, place, and time.  Psychiatric:        Mood and Affect: Mood normal.        Behavior: Behavior normal.      ED Treatments / Results  Labs (all labs ordered are listed, but only abnormal results are displayed) Labs Reviewed  CBC WITH DIFFERENTIAL/PLATELET - Abnormal; Notable for the following components:      Result Value   WBC 12.8 (*)    Neutro Abs 10.5 (*)    Monocytes Absolute 1.5 (*)    All other components within normal limits  COMPREHENSIVE METABOLIC PANEL - Abnormal; Notable for the following components:   Glucose, Bld 100 (*)    Calcium 8.7 (*)    AST 12 (*)    All other components within normal limits  URINALYSIS, ROUTINE W REFLEX MICROSCOPIC - Abnormal; Notable for the following components:   Hgb urine dipstick SMALL (*)    Protein, ur 30 (*)    All other components within normal limits  URINALYSIS, MICROSCOPIC (REFLEX) - Abnormal; Notable for the following components:   Bacteria, UA RARE (*)    All other components within normal limits  LIPASE, BLOOD  PREGNANCY, URINE    EKG None  Radiology Ct Abdomen Pelvis W Contrast  Result Date: 09/07/2018 CLINICAL DATA:  Nausea and vomiting of lower abdominal pain EXAM: CT ABDOMEN AND PELVIS WITH CONTRAST TECHNIQUE:  Multidetector CT imaging of the abdomen and pelvis was performed using the standard  protocol following bolus administration of intravenous contrast. Oral contrast was also administered. CONTRAST:  ISOVUE-300 IOPAMIDOL (ISOVUE-300) INJECTION 61%, 30mL ISOVUE-300 IOPAMIDOL (ISOVUE-300) INJECTION 61% COMPARISON:  September 06, 2018 FINDINGS: Lower chest: There is slight bibasilar atelectasis. No lung base edema or consolidation evident. Hepatobiliary: No focal liver lesions are appreciable. There is mild focal fatty infiltration near the fissure for the ligamentum teres. Gallbladder wall is not appreciably thickened. There is no biliary duct dilatation. Pancreas: No pancreatic mass or inflammatory focus. Spleen: No splenic lesions are evident. Adrenals/Urinary Tract: Adrenals bilaterally appear unremarkable. There is no hydronephrosis on either side. There is no well-defined renal mass on either side. Note that there is an area of decreased attenuation in the lower pole left kidney region which appears somewhat wedge-shaped. This area measures just over 1 x 1 cm. No similar changes noted elsewhere in the kidneys. There is no evident renal or ureteral calculus. No hydronephrosis on either side. Urinary bladder is midline with wall thickness within normal limits. Stomach/Bowel: The rectum is distended with air. There is no appreciable bowel wall or mesenteric thickening. No changes of diverticulitis are appreciable. There is no appreciable bowel obstruction. No abscess in the abdomen or pelvis. Note that there is no longer mesenteric thickening adjacent to the descending colon in the upper pelvic region on the left as was noted 1 day prior. Vascular/Lymphatic: There is no abdominal aortic aneurysm. No vascular lesions are demonstrable. There are several focal lymph nodes in the left periaortic region at the mid left kidney level, largest measuring 1.2 x 0.8 cm. By size criteria, there is no frank adenopathy in the  abdomen or pelvis. Reproductive: Uterus is anteverted.  No evident pelvic mass. Other: Appendix appears unremarkable. No evident abscess or ascites in the abdomen or pelvis. Musculoskeletal: No blastic or lytic bone lesions. No intramuscular or abdominal wall lesions are evident. IMPRESSION: 1. Focal area of decreased attenuation in the lower pole of the left kidney in a somewhat wedge-shaped manner. Question a degree of early lobar nephronia/bacterial nephritis. Appropriate laboratory assessment advised in this regard. No frank abscess. Kidneys otherwise appear normal bilaterally. No hydronephrosis on either side. No renal or ureteral calculus on either side. No urinary bladder wall thickening. 2. Several focal lymph nodes in the left mid periaortic region near the left kidney. Question inflammatory etiology secondary to the changes in the lower pole left kidney. No frank adenopathy on this study by size criteria. 3. The questionable wall thickening along the lateral mid descending colon seen 1 day prior is no longer appreciable. There are no changes currently suggesting bowel inflammation. No diverticulitis in particular. No bowel obstruction. 4. No abscess evident in the abdomen or pelvis. Appendix appears normal. Electronically Signed   By: Bretta Bang III M.D.   On: 09/07/2018 12:19   Ct Renal Stone Study  Result Date: 09/06/2018 CLINICAL DATA:  Left lower quadrant abdominal pain and nausea since last night. EXAM: CT ABDOMEN AND PELVIS WITHOUT CONTRAST TECHNIQUE: Multidetector CT imaging of the abdomen and pelvis was performed following the standard protocol without IV contrast. COMPARISON:  None. FINDINGS: Lower chest: Clear lung bases. No significant pleural or pericardial effusion. Hepatobiliary: The liver appears unremarkable as imaged in the noncontrast state. No evidence of gallstones, gallbladder wall thickening or biliary dilatation. Pancreas: Unremarkable. No pancreatic ductal dilatation or  surrounding inflammatory changes. Spleen: Normal in size without focal abnormality. Adrenals/Urinary Tract: Both adrenal glands appear normal. There is mildly increased density in the region of the  renal pyramids bilaterally, but no discrete renal, ureteral or bladder calculi. There is no hydronephrosis or perinephric soft tissue stranding. The bladder appears unremarkable for its degree of distention. Stomach/Bowel: No evidence of bowel wall thickening or distention. Probable visualization of a normal appearing appendix. There is questionable mild inflammatory changes surrounding the descending colon. No extraluminal fluid collection. Vascular/Lymphatic: There are no enlarged abdominal or pelvic lymph nodes. No significant vascular findings on noncontrast imaging. Reproductive: The uterus and ovaries appear unremarkable. No adnexal mass. Other: No evidence of abdominal wall mass or hernia. No ascites or free air. Musculoskeletal: No acute or significant osseous findings. IMPRESSION: 1. No evidence of urinary tract calculus or hydronephrosis. 2. Questionable mild soft tissue stranding adjacent to the descending colon. This could reflect mild diverticulitis or appendagitis epiploica. No appreciable bowel wall thickening, bowel obstruction or appendiceal abnormality. Electronically Signed   By: Carey BullocksWilliam  Veazey M.D.   On: 09/06/2018 12:51    Procedures Procedures (including critical care time)  Medications Ordered in ED Medications  sodium chloride 0.9 % bolus 1,000 mL (0 mLs Intravenous Stopped 09/07/18 1032)  ondansetron (ZOFRAN) injection 4 mg (4 mg Intravenous Given 09/07/18 0927)  morphine 4 MG/ML injection 4 mg (4 mg Intravenous Given 09/07/18 0927)  iopamidol (ISOVUE-300) 61 % injection 30 mL (30 mLs Oral Contrast Given 09/07/18 0930)  iopamidol (ISOVUE-300) 61 % injection 100 mL (100 mLs Intravenous Contrast Given 09/07/18 1200)  HYDROmorphone (DILAUDID) injection 1 mg (1 mg Intravenous Given 09/07/18 1059)    sodium chloride 0.9 % bolus 1,000 mL (0 mLs Intravenous Stopped 09/07/18 1349)  HYDROmorphone (DILAUDID) injection 1 mg (1 mg Intravenous Given 09/07/18 1244)  cefTRIAXone (ROCEPHIN) 2 g in sodium chloride 0.9 % 100 mL IVPB (0 g Intravenous Stopped 09/07/18 1349)     Initial Impression / Assessment and Plan / ED Course  I have reviewed the triage vital signs and the nursing notes.  Pertinent labs & imaging results that were available during my care of the patient were reviewed by me and considered in my medical decision making (see chart for details).    Pt given IVFs, zofran, morphine, dilaudid for symptomatic relief.  She is still in pain, but has been able to keep down the oral contrast.  She does have what looks to be a pyelo with possible early lobar nephroma to the left kidney.  As pt has failed outpatient treatment and is still in pain, I thinks she needs to come in for further fluids/iv meds.    Pt d/w Dr. Lurene ShadowSpongberg ([facility]) for admission.  Final Clinical Impressions(s) / ED Diagnoses   Final diagnoses:  Pyelonephritis  Failure of outpatient treatment  Dehydration  Non-intractable vomiting with nausea, unspecified vomiting type    ED Discharge Orders    None       Jacalyn LefevreHaviland, Jaquay Morneault, MD 09/07/18 1401

## 2018-09-07 NOTE — Progress Notes (Signed)
This is a 18 year old female who is who was seen at [facility] on January 8 for left flank plain.  She had a CT renal which showed possible inflammation left lower quadrant she was DC'd home on Cipro Flagyl Zofran and Percocet.  Patient represented to [facility] today and urine culture showed E. coli with 100,000.  Patient also reported that she was unable to keep any p.o. medications down at home with continued nausea and vomiting.  As a result she was accepted for admission to [facility] for IV antibiotics and antiemetics.  Patient's vital signs show temperature 99, blood pressure 103/58, pulse 84 respirations 18 with a BMI of 22.

## 2018-09-07 NOTE — H&P (Signed)
History and Physical    Danielle Ortiz BJY:[id number] DOB: August 25, 2001 DOA: 09/07/2018  PCP: Patient, No Pcp Per Patient coming from: Home  I have personally briefly reviewed patient's old medical records in [facility]  Chief Complaint: I have a UTI.  Left sided pain.  HPI: Danielle Ortiz is a 18 y.o. female with medical history significant of depression, occipital neuralgia of the right side who presents to med [facility] with excruciating ongoing left flank pain. Patient states she has been having excruciating left flank pain that has been constant and ongoing for 2 days with no radiation with associated chills, nausea and emesis and inability to keep anything down.  Patient states was seen in the ED 1 day prior to admission and that time was diagnosed with a inflammation/infection of her descending colon sent home on oral ciprofloxacin and Flagyl as well as pain medication however unable to keep anything down with ongoing pain and presented back to the ED.  Patient denies any syncopal episodes, no abdominal pain, no diarrhea, no constipation, no dysuria, no melena, no hematemesis, no hematochezia, no hematuria, no syncopal episode.  Patient states was noted to have a fever 101.8 in the ED.  Patient does endorse some generalized weakness, and lightheadedness.  ED Course: Patient seen in the ED, comprehensive metabolic profile done had a glucose of 100 otherwise was unremarkable.  CBC done with a white count of 12.8 otherwise was unremarkable.  Urinalysis done on 09/06/2018 had small leukocytes, nitrite negative, many bacteria, 11-20 WBCs, urine pregnancy negative.  Urinalysis repeat done on 09/07/2018 showed nitrite negative leukocytes negative, protein of 30, specific gravity 1.025, rare bacteria, WBC 0-5.  CT abdomen and pelvis which was done on 09/07/2018 with concerns for early lobar nephronia/bacterial nephritis, no frank abscess, no hydronephrosis, no renal or  ureteral calculus on either side, no urinary bladder wall thickening.  Questionable wall thickening along the lateral mid descending colon seen 1 day prior no longer appreciable.  Negative for diverticulitis.  No bowel obstruction.  No abscess evident in the abdomen or pelvis.  Appendix normal.  Patient given 2 g IV Rocephin in the ED, IV Dilaudid, fluid bolus and hospitalist were called to admit the patient.  Review of Systems: As per HPI otherwise 10 point review of systems negative.   Past Medical History:  Diagnosis Date  . Depression   . Medical history non-contributory   . Strep throat     Past Surgical History:  Procedure Laterality Date  . WISDOM TOOTH EXTRACTION       reports that she has been smoking. She has never used smokeless tobacco. She reports current alcohol use. She reports current drug use. Drug: Marijuana.  No Known Allergies  Family History  Problem Relation Age of Onset  . Migraines Mother   . Migraines Maternal Aunt   . Migraines Maternal Grandmother   . Depression Maternal Grandmother   . Seizures Neg Hx   . Anxiety disorder Neg Hx   . Bipolar disorder Neg Hx   . Schizophrenia Neg Hx   . ADD / ADHD Neg Hx   . Autism Neg Hx    Mother alive age 76 and healthy.  Mother states history of diabetes on both sides of the family.  Father alive health status unknown.  Prior to Admission medications   Medication Sig Start Date End Date Taking? Authorizing Provider  ciprofloxacin (CIPRO) 500 MG tablet Take 1 tablet (500 mg total) by mouth 2 (  two) times daily. 09/06/18  Yes Petrucelli, Samantha R, PA-C  metroNIDAZOLE (FLAGYL) 500 MG tablet Take 1 tablet (500 mg total) by mouth 3 (three) times daily. 09/06/18  Yes Petrucelli, Samantha R, PA-C  oxyCODONE-acetaminophen (PERCOCET/ROXICET) 5-325 MG tablet Take 1-2 tablets by mouth every 6 (six) hours as needed for severe pain. 09/06/18  Yes Petrucelli, Samantha R, PA-C  naproxen (NAPROSYN) 500 MG tablet Take 1 tablet (500 mg  total) by mouth 2 (two) times daily. 09/06/18   Petrucelli, Samantha R, PA-C  ondansetron (ZOFRAN ODT) 4 MG disintegrating tablet Take 1 tablet (4 mg total) by mouth every 8 (eight) hours as needed for nausea or vomiting. 09/06/18   Cherly AndersonPetrucelli, Samantha R, PA-C    Physical Exam: Vitals:   09/07/18 1148 09/07/18 1437 09/07/18 1626 09/07/18 1738  BP: (!) 103/58 117/60 120/65 119/70  Pulse: 84 89 91 86  Resp: 18 18 16 18   Temp: 99 F (37.2 C)  99.9 F (37.7 C) (!) 101.8 F (38.8 C)  TempSrc: Oral  Oral Oral  SpO2: 100% (!) 33% 100% 99%  Weight:      Height:        Constitutional: NAD, calm, comfortable Vitals:   09/07/18 1148 09/07/18 1437 09/07/18 1626 09/07/18 1738  BP: (!) 103/58 117/60 120/65 119/70  Pulse: 84 89 91 86  Resp: 18 18 16 18   Temp: 99 F (37.2 C)  99.9 F (37.7 C) (!) 101.8 F (38.8 C)  TempSrc: Oral  Oral Oral  SpO2: 100% (!) 33% 100% 99%  Weight:      Height:       Eyes: PERRL, lids and conjunctivae normal ENMT: Mucous membranes are dry. Posterior pharynx clear of any exudate or lesions.Normal dentition.  Neck: normal, supple, no masses, no thyromegaly Respiratory: clear to auscultation bilaterally, no wheezing, no crackles. Normal respiratory effort. No accessory muscle use.  Cardiovascular: Regular rate and rhythm, no murmurs / rubs / gallops. No extremity edema. 2+ pedal pulses. No carotid bruits.  Abdomen: no tenderness, no masses palpated. No hepatosplenomegaly. Bowel sounds positive.  Left CVA tenderness to palpation. Musculoskeletal: no clubbing / cyanosis. No joint deformity upper and lower extremities. Good ROM, no contractures. Normal muscle tone.  Skin: no rashes, lesions, ulcers. No induration Neurologic: CN 2-12 grossly intact. Sensation intact, DTR normal. Strength 5/5 in all 4.  Psychiatric: Normal judgment and insight. Alert and oriented x 3. Normal mood.   Labs on Admission: I have personally reviewed following labs and imaging  studies  CBC: Recent Labs  Lab 09/06/18 1154 09/07/18 0920  WBC 12.2* 12.8*  NEUTROABS  --  10.5*  HGB 14.8 13.1  HCT 46.0 40.4  MCV 90.9 91.8  PLT 303 226   Basic Metabolic Panel: Recent Labs  Lab 09/06/18 1154 09/07/18 0920  NA 136 136  K 3.5 3.6  CL 103 107  CO2 24 23  GLUCOSE 108* 100*  BUN 7 8  CREATININE 0.80 0.94  CALCIUM 9.5 8.7*   GFR: Estimated Creatinine Clearance: 79.6 mL/min (by C-G formula based on SCr of 0.94 mg/dL). Liver Function Tests: Recent Labs  Lab 09/06/18 1154 09/07/18 0920  AST 16 12*  ALT 12 10  ALKPHOS 53 44  BILITOT 1.2 0.7  PROT 7.9 6.6  ALBUMIN 4.8 4.0   Recent Labs  Lab 09/07/18 0920  LIPASE 22   No results for input(s): AMMONIA in the last 168 hours. Coagulation Profile: No results for input(s): INR, PROTIME in the last 168 hours. Cardiac Enzymes:  No results for input(s): CKTOTAL, CKMB, CKMBINDEX, TROPONINI in the last 168 hours. BNP (last 3 results) No results for input(s): PROBNP in the last 8760 hours. HbA1C: No results for input(s): HGBA1C in the last 72 hours. CBG: No results for input(s): GLUCAP in the last 168 hours. Lipid Profile: No results for input(s): CHOL, HDL, LDLCALC, TRIG, CHOLHDL, LDLDIRECT in the last 72 hours. Thyroid Function Tests: No results for input(s): TSH, T4TOTAL, FREET4, T3FREE, THYROIDAB in the last 72 hours. Anemia Panel: No results for input(s): VITAMINB12, FOLATE, FERRITIN, TIBC, IRON, RETICCTPCT in the last 72 hours. Urine analysis:    Component Value Date/Time   COLORURINE YELLOW 09/07/2018 0920   APPEARANCEUR CLEAR 09/07/2018 0920   LABSPEC 1.025 09/07/2018 0920   PHURINE 6.0 09/07/2018 0920   GLUCOSEU NEGATIVE 09/07/2018 0920   HGBUR SMALL (A) 09/07/2018 0920   BILIRUBINUR NEGATIVE 09/07/2018 0920   KETONESUR NEGATIVE 09/07/2018 0920   PROTEINUR 30 (A) 09/07/2018 0920   NITRITE NEGATIVE 09/07/2018 0920   LEUKOCYTESUR NEGATIVE 09/07/2018 0920    Radiological Exams on  Admission: Ct Abdomen Pelvis W Contrast  Result Date: 09/07/2018 CLINICAL DATA:  Nausea and vomiting of lower abdominal pain EXAM: CT ABDOMEN AND PELVIS WITH CONTRAST TECHNIQUE: Multidetector CT imaging of the abdomen and pelvis was performed using the standard protocol following bolus administration of intravenous contrast. Oral contrast was also administered. CONTRAST:  ISOVUE-300 IOPAMIDOL (ISOVUE-300) INJECTION 61%, 30mL ISOVUE-300 IOPAMIDOL (ISOVUE-300) INJECTION 61% COMPARISON:  September 06, 2018 FINDINGS: Lower chest: There is slight bibasilar atelectasis. No lung base edema or consolidation evident. Hepatobiliary: No focal liver lesions are appreciable. There is mild focal fatty infiltration near the fissure for the ligamentum teres. Gallbladder wall is not appreciably thickened. There is no biliary duct dilatation. Pancreas: No pancreatic mass or inflammatory focus. Spleen: No splenic lesions are evident. Adrenals/Urinary Tract: Adrenals bilaterally appear unremarkable. There is no hydronephrosis on either side. There is no well-defined renal mass on either side. Note that there is an area of decreased attenuation in the lower pole left kidney region which appears somewhat wedge-shaped. This area measures just over 1 x 1 cm. No similar changes noted elsewhere in the kidneys. There is no evident renal or ureteral calculus. No hydronephrosis on either side. Urinary bladder is midline with wall thickness within normal limits. Stomach/Bowel: The rectum is distended with air. There is no appreciable bowel wall or mesenteric thickening. No changes of diverticulitis are appreciable. There is no appreciable bowel obstruction. No abscess in the abdomen or pelvis. Note that there is no longer mesenteric thickening adjacent to the descending colon in the upper pelvic region on the left as was noted 1 day prior. Vascular/Lymphatic: There is no abdominal aortic aneurysm. No vascular lesions are demonstrable. There  are several focal lymph nodes in the left periaortic region at the mid left kidney level, largest measuring 1.2 x 0.8 cm. By size criteria, there is no frank adenopathy in the abdomen or pelvis. Reproductive: Uterus is anteverted.  No evident pelvic mass. Other: Appendix appears unremarkable. No evident abscess or ascites in the abdomen or pelvis. Musculoskeletal: No blastic or lytic bone lesions. No intramuscular or abdominal wall lesions are evident. IMPRESSION: 1. Focal area of decreased attenuation in the lower pole of the left kidney in a somewhat wedge-shaped manner. Question a degree of early lobar nephronia/bacterial nephritis. Appropriate laboratory assessment advised in this regard. No frank abscess. Kidneys otherwise appear normal bilaterally. No hydronephrosis on either side. No renal or ureteral calculus on  either side. No urinary bladder wall thickening. 2. Several focal lymph nodes in the left mid periaortic region near the left kidney. Question inflammatory etiology secondary to the changes in the lower pole left kidney. No frank adenopathy on this study by size criteria. 3. The questionable wall thickening along the lateral mid descending colon seen 1 day prior is no longer appreciable. There are no changes currently suggesting bowel inflammation. No diverticulitis in particular. No bowel obstruction. 4. No abscess evident in the abdomen or pelvis. Appendix appears normal. Electronically Signed   By: Bretta Bang III M.D.   On: 09/07/2018 12:19   Ct Renal Stone Study  Result Date: 09/06/2018 CLINICAL DATA:  Left lower quadrant abdominal pain and nausea since last night. EXAM: CT ABDOMEN AND PELVIS WITHOUT CONTRAST TECHNIQUE: Multidetector CT imaging of the abdomen and pelvis was performed following the standard protocol without IV contrast. COMPARISON:  None. FINDINGS: Lower chest: Clear lung bases. No significant pleural or pericardial effusion. Hepatobiliary: The liver appears unremarkable  as imaged in the noncontrast state. No evidence of gallstones, gallbladder wall thickening or biliary dilatation. Pancreas: Unremarkable. No pancreatic ductal dilatation or surrounding inflammatory changes. Spleen: Normal in size without focal abnormality. Adrenals/Urinary Tract: Both adrenal glands appear normal. There is mildly increased density in the region of the renal pyramids bilaterally, but no discrete renal, ureteral or bladder calculi. There is no hydronephrosis or perinephric soft tissue stranding. The bladder appears unremarkable for its degree of distention. Stomach/Bowel: No evidence of bowel wall thickening or distention. Probable visualization of a normal appearing appendix. There is questionable mild inflammatory changes surrounding the descending colon. No extraluminal fluid collection. Vascular/Lymphatic: There are no enlarged abdominal or pelvic lymph nodes. No significant vascular findings on noncontrast imaging. Reproductive: The uterus and ovaries appear unremarkable. No adnexal mass. Other: No evidence of abdominal wall mass or hernia. No ascites or free air. Musculoskeletal: No acute or significant osseous findings. IMPRESSION: 1. No evidence of urinary tract calculus or hydronephrosis. 2. Questionable mild soft tissue stranding adjacent to the descending colon. This could reflect mild diverticulitis or appendagitis epiploica. No appreciable bowel wall thickening, bowel obstruction or appendiceal abnormality. Electronically Signed   By: Carey Bullocks M.D.   On: 09/06/2018 12:51    EKG: Independently reviewed.  Not done  Assessment/Plan Principal Problem:   Acute pyelonephritis Active Problems:   UTI (urinary tract infection)   E. coli UTI   Dehydration   Non-intractable vomiting    1 acute left pyelonephritis/E. coli UTI Patient presenting with a 2-day history of excruciating left flank pain constant in nature nonradiating.  Urinalysis done 09/06/2018 in the ED with small  leukocytes, nitrite negative, many bacteria, 11-20 WBCs.  Urine cultures from 09/06/2018 with greater than 100,000 E. coli with sensitivities pending.  Repeat urinalysis done on day of admission nitrite negative leukocytes -0-5 WBCs.  Patient was discharged on oral ciprofloxacin and Flagyl due to concerns for colitis 09/06/2018 however patient unable to keep anything down with ongoing nausea and emesis and as such failed outpatient treatment.  Check blood cultures x2.  Place on IV Rocephin.  Place on scheduled Zofran for the next 24 hours, IV Toradol as needed.  IV Dilaudid as needed if no improvement on Toradol.  IV fluids.  Supportive care.  2.  Dehydration IV fluids.  3.  Nausea and vomiting Likely secondary to problem #1.  Placed on scheduled Zofran for the next 24 hours.  Placed on clear liquid diet for now.  Supportive care.  4.  Fever Patient noted to have a temp of 101.8 on presentation.  Fever likely secondary to problem #1 however patient with nausea and emesis over the past 24 to 36 hours.  Check blood cultures x2.  Check a chest x-ray.  Place on IV Rocephin.  Follow.  DVT prophylaxis: Lovenox Code Status: Full Family Communication: Updated patient and mother at bedside. Disposition Plan: Home when clinically improved, tolerating oral intake. Consults called: None Admission status: Place in observation.   Ramiro Harvest MD [facility]  If 7PM-7AM, please contact night-coverage [email or web address] Password [facility]  09/07/2018, 6:51 PM

## 2018-09-08 DIAGNOSIS — Z789 Other specified health status: Secondary | ICD-10-CM

## 2018-09-08 LAB — BASIC METABOLIC PANEL
Anion gap: 4 — ABNORMAL LOW (ref 5–15)
BUN: 5 mg/dL — ABNORMAL LOW (ref 6–20)
CO2: 25 mmol/L (ref 22–32)
Calcium: 8 mg/dL — ABNORMAL LOW (ref 8.9–10.3)
Chloride: 110 mmol/L (ref 98–111)
Creatinine, Ser: 0.79 mg/dL (ref 0.44–1.00)
GFR calc Af Amer: >60 mL/min (ref >60)
GFR calc non Af Amer: >60 mL/min (ref >60)
Glucose, Bld: 88 mg/dL (ref 70–99)
Potassium: 3.4 mmol/L — ABNORMAL LOW (ref 3.5–5.1)
Sodium: 139 mmol/L (ref 135–145)

## 2018-09-08 LAB — CBC
HCT: 35.8 % — ABNORMAL LOW (ref 36.0–46.0)
Hemoglobin: 11.5 g/dL — ABNORMAL LOW (ref 12.0–15.0)
MCH: 30.2 pg (ref 26.0–34.0)
MCHC: 32.1 g/dL (ref 30.0–36.0)
MCV: 94 fL (ref 80.0–100.0)
NRBC: 0 % (ref 0.0–0.2)
Platelets: 182 10*3/uL (ref 150–400)
RBC: 3.81 MIL/uL — ABNORMAL LOW (ref 3.87–5.11)
RDW: 12.7 % (ref 11.5–15.5)
WBC: 9.9 10*3/uL (ref 4.0–10.5)

## 2018-09-08 LAB — URINE CULTURE: Culture: >=100000 — AB

## 2018-09-08 LAB — HIV ANTIBODY (ROUTINE TESTING W REFLEX): HIV Screen 4th Generation wRfx: NONREACTIVE

## 2018-09-08 LAB — MAGNESIUM: Magnesium: 2 mg/dL (ref 1.7–2.4)

## 2018-09-08 MED ORDER — KETOROLAC TROMETHAMINE 30 MG/ML IJ SOLN
15.0000 mg | Freq: Four times a day (QID) | INTRAMUSCULAR | Status: DC | PRN
  Administered 2018-09-09 (×2): 15 mg via INTRAVENOUS
  Filled 2018-09-08 (×2): qty 1

## 2018-09-08 MED ORDER — CEPHALEXIN 500 MG PO CAPS
500.0000 mg | ORAL_CAPSULE | Freq: Three times a day (TID) | ORAL | Status: DC
  Administered 2018-09-08 – 2018-09-09 (×3): 500 mg via ORAL
  Filled 2018-09-08 (×3): qty 1

## 2018-09-08 MED ORDER — ONDANSETRON HCL 4 MG/2ML IJ SOLN: 4.0000 mg | Freq: Three times a day (TID) | INTRAMUSCULAR | Status: DC | PRN

## 2018-09-08 NOTE — Progress Notes (Signed)
Patient and mother requesting to move from Clear Liquid Diet to Full Liquid. I paged Dr.Gonfa who stated patient could advance. I will place new order for full liquid diet and notify patient and her mother.

## 2018-09-08 NOTE — Progress Notes (Signed)
PROGRESS NOTE  Danielle Braulio BoschHesham Ahmed Hourihan UJW:[id number] DOB: 04/11/2001 DOA: 09/07/2018 PCP: Patient, No Pcp Per   LOS: 0 days   Brief Narrative / Interim history: 18 year old female with history of depression and occipital neuralgia   Admitted with acute left pyelonephritis as noted on CT abdomen and pelvis.  Started on ceftriaxone with improvement in her symptoms.   Transitioned to p.o. Keflex on 01/10.   Blood cultures  Negative so far.   Subjective: Reports some improvement in her pain.   Still with nausea but denies emesis.   Denies dysuria, vaginal discharge or bleeding.  Assessment & Plan: Principal Problem:   Acute pyelonephritis Active Problems:   UTI (urinary tract infection)   E-coli UTI   Dehydration   Non-intractable vomiting   Fever  Acute left pyelonephritis / E coli UTI:  Blood culture negative.   Clinically improving but still with some pain, CVA tenderness and nausea. - Ceftriaxone 1/9 to 1/10 -  Keflex 500 mg t.i.d. -  Pain management. -  Zofran  p.r.n. nausea and vomiting -  Decrease IV fluids from 125-75 cc/hr -  Advanced diet   Nausea/vomiting /fever:  Likely due to the above -  Manage as above.    Depression:  Stable. -  Continue home Prozac. Scheduled Meds: . cephALEXin  500 mg Oral Q8H  . enoxaparin (LOVENOX) injection  40 mg Subcutaneous Q24H  . FLUoxetine  10 mg Oral Daily  . sodium chloride flush  3 mL Intravenous Q12H   Continuous Infusions: . sodium chloride 75 mL/hr at 09/08/18 1015   PRN Meds:.acetaminophen **OR** acetaminophen, albuterol, HYDROmorphone (DILAUDID) injection, ketorolac, magnesium citrate, ondansetron (ZOFRAN) IV, oxyCODONE, polyethylene glycol, prochlorperazine, sorbitol  DVT prophylaxis:   Subcu Lovenox Code Status:  Full code Family Communication:  No family member at bedside Disposition Plan:  Remains inpatient pending p.o. tolerance and clinical improvement in pyelonephritis.  Still with nausea.   Anticipate  discharge in the next 24 hours if she remains afebrile and tolerates p.o.  Consultants:      none  Procedures:    none  Antimicrobials:  Seftriaxone 1/9 to 1/10   Keflex 1/10-->  Objective: Vitals:   09/07/18 2141 09/08/18 0543 09/08/18 0700 09/08/18 1452  BP: (!) 109/57 (!) 101/59  105/64  Pulse: (!) 58 (!) 55  64  Resp: 16 16  15   Temp: 97.8 F (36.6 C) 98.2 F (36.8 C)  99 F (37.2 C)  TempSrc:  Oral    SpO2: 100% 100%  100%  Weight:   62.6 kg   Height:        Intake/Output Summary (Last 24 hours) at 09/08/2018 1635 Last data filed at 09/08/2018 1400 Gross per 24 hour  Intake 2660 ml  Output -  Net 2660 ml   Filed Weights   09/07/18 0908 09/08/18 0700  Weight: 56.8 kg 62.6 kg    Examination:  GENERAL: Appears well. No acute distress.  HEENT: MMM.  Vision and Hearing grossly intact.  NECK: Supple.  No JVD.  LUNGS:  No IWOB. Good air movement. CTAB.  HEART:  RRR. Heart sounds normal.  ABD: Bowel sounds present. Soft. Non tender.  GU:  Suprapubic tenderness and CVA tenderness on the left. MSK/EXT:   no edema bilaterally.  SKIN: no apparent skin lesion.  NEURO: Awake, alert and oriented appropriately.  No gross deficit.  PSYCH: Calm. Normal affect.   Data Reviewed: I have independently reviewed following labs and imaging studies  CBC: Recent Labs  Lab  09/06/18 1154 09/07/18 0920 09/08/18 0534  WBC 12.2* 12.8* 9.9  NEUTROABS  --  10.5*  --   HGB 14.8 13.1 11.5*  HCT 46.0 40.4 35.8*  MCV 90.9 91.8 94.0  PLT 303 226 182   Basic Metabolic Panel: Recent Labs  Lab 09/06/18 1154 09/07/18 0920 09/07/18 1842 09/08/18 0534  NA 136 136  --  139  K 3.5 3.6  --  3.4*  CL 103 107  --  110  CO2 24 23  --  25  GLUCOSE 108* 100*  --  88  BUN 7 8  --  5*  CREATININE 0.80 0.94  --  0.79  CALCIUM 9.5 8.7*  --  8.0*  MG  --   --  1.6* 2.0   GFR: Estimated Creatinine Clearance: 93.6 mL/min (by C-G formula based on SCr of 0.79 mg/dL). Liver Function  Tests: Recent Labs  Lab 09/06/18 1154 09/07/18 0920  AST 16 12*  ALT 12 10  ALKPHOS 53 44  BILITOT 1.2 0.7  PROT 7.9 6.6  ALBUMIN 4.8 4.0   Recent Labs  Lab 09/07/18 0920  LIPASE 22   No results for input(s): AMMONIA in the last 168 hours. Coagulation Profile: No results for input(s): INR, PROTIME in the last 168 hours. Cardiac Enzymes: No results for input(s): CKTOTAL, CKMB, CKMBINDEX, TROPONINI in the last 168 hours. BNP (last 3 results) No results for input(s): PROBNP in the last 8760 hours. HbA1C: No results for input(s): HGBA1C in the last 72 hours. CBG: No results for input(s): GLUCAP in the last 168 hours. Lipid Profile: No results for input(s): CHOL, HDL, LDLCALC, TRIG, CHOLHDL, LDLDIRECT in the last 72 hours. Thyroid Function Tests: No results for input(s): TSH, T4TOTAL, FREET4, T3FREE, THYROIDAB in the last 72 hours. Anemia Panel: No results for input(s): VITAMINB12, FOLATE, FERRITIN, TIBC, IRON, RETICCTPCT in the last 72 hours. Urine analysis:    Component Value Date/Time   COLORURINE YELLOW 09/07/2018 0920   APPEARANCEUR CLEAR 09/07/2018 0920   LABSPEC 1.025 09/07/2018 0920   PHURINE 6.0 09/07/2018 0920   GLUCOSEU NEGATIVE 09/07/2018 0920   HGBUR SMALL (A) 09/07/2018 0920   BILIRUBINUR NEGATIVE 09/07/2018 0920   KETONESUR NEGATIVE 09/07/2018 0920   PROTEINUR 30 (A) 09/07/2018 0920   NITRITE NEGATIVE 09/07/2018 0920   LEUKOCYTESUR NEGATIVE 09/07/2018 0920   Sepsis Labs: Invalid input(s): PROCALCITONIN, LACTICIDVEN  Recent Results (from the past 240 hour(s))  Urine culture     Status: Abnormal   Collection Time: 09/06/18 11:46 AM  Result Value Ref Range Status   Specimen Description   Final    URINE, CLEAN CATCH Performed at [facility], [address] [place], [place] [address]    Special Requests   Final    NONE Performed at [facility], [address] [place], [place] [address]    Culture >=100,000 COLONIES/mL  ESCHERICHIA COLI (A)  Final   Report Status 09/08/2018 FINAL  Final   Organism ID, Bacteria ESCHERICHIA COLI (A)  Final      Susceptibility   Escherichia coli - MIC*    AMPICILLIN 4 SENSITIVE Sensitive     CEFAZOLIN <=4 SENSITIVE Sensitive     CEFTRIAXONE <=1 SENSITIVE Sensitive     CIPROFLOXACIN <=0.25 SENSITIVE Sensitive     GENTAMICIN <=1 SENSITIVE Sensitive     IMIPENEM <=0.25 SENSITIVE Sensitive     NITROFURANTOIN <=16 SENSITIVE Sensitive     TRIMETH/SULFA <=20 SENSITIVE Sensitive     AMPICILLIN/SULBACTAM <=2  SENSITIVE Sensitive     PIP/TAZO <=4 SENSITIVE Sensitive     Extended ESBL NEGATIVE Sensitive     * >=100,000 COLONIES/mL ESCHERICHIA COLI  Culture, blood (Routine X 2) w Reflex to ID Panel     Status: None (Preliminary result)   Collection Time: 09/07/18  7:11 PM  Result Value Ref Range Status   Specimen Description BLOOD LEFT ARM  Final   Special Requests   Final    BOTTLES DRAWN AEROBIC AND ANAEROBIC Blood Culture results may not be optimal due to an excessive volume of blood received in culture bottles Performed at [facility], [address] [place], [place] [address]    Culture   Final    NO GROWTH < 24 HOURS Performed at [facility], [address] [place], [place] [address]    Report Status PENDING  Incomplete  Culture, blood (Routine X 2) w Reflex to ID Panel     Status: None (Preliminary result)   Collection Time: 09/07/18  7:11 PM  Result Value Ref Range Status   Specimen Description BLOOD LEFT ARM  Final   Special Requests   Final    BOTTLES DRAWN AEROBIC AND ANAEROBIC Blood Culture results may not be optimal due to an excessive volume of blood received in culture bottles Performed at [facility], [address] [place], [place] [address]    Culture   Final    NO GROWTH < 24 HOURS Performed at [facility], [address] [place], [place] [address]    Report Status PENDING  Incomplete       Radiology Studies: X-ray Chest Pa And Lateral  Result Date: 09/07/2018 CLINICAL DATA:  Fever, flank pain, nausea and vomiting EXAM: CHEST - 2 VIEW COMPARISON:  05/30/2015 FINDINGS: The heart size and mediastinal contours are within normal limits. Both lungs are clear. The visualized skeletal structures are unremarkable. Overlying nipple piercings. IMPRESSION: No active cardiopulmonary disease. Electronically Signed   By: Judie PetitM.  Shick M.D.   On: 09/07/2018 19:54       [facility] Pager [phone]  If 7PM-7AM, please contact night-coverage [email or web address] Password [email or web address] 09/08/2018, 4:35 PM

## 2018-09-09 ENCOUNTER — Telehealth: Payer: Self-pay | Admitting: Emergency Medicine

## 2018-09-09 DIAGNOSIS — E876 Hypokalemia: Secondary | ICD-10-CM

## 2018-09-09 LAB — CBC
HCT: 33.4 % — ABNORMAL LOW (ref 36.0–46.0)
HEMOGLOBIN: 10.8 g/dL — AB (ref 12.0–15.0)
MCH: 30 pg (ref 26.0–34.0)
MCHC: 32.3 g/dL (ref 30.0–36.0)
MCV: 92.8 fL (ref 80.0–100.0)
Platelets: 206 10*3/uL (ref 150–400)
RBC: 3.6 MIL/uL — ABNORMAL LOW (ref 3.87–5.11)
RDW: 12.5 % (ref 11.5–15.5)
WBC: 7.7 10*3/uL (ref 4.0–10.5)
nRBC: 0 % (ref 0.0–0.2)

## 2018-09-09 MED ORDER — CEPHALEXIN 500 MG PO CAPS: 500.0000 mg | ORAL_CAPSULE | Freq: Three times a day (TID) | ORAL | 0 refills | Status: AC

## 2018-09-09 NOTE — Discharge Summary (Signed)
Discharge Summary  Danielle Ortiz ZOX:[id number] DOB: 07/23/2001  PCP: Patient, No Pcp Per  Admit date: 09/07/2018 Discharge date: 09/09/2018  Time spent:  Recommendations for Outpatient Follow-up:  1. F/u with PCP within a week  for hospital discharge follow up, repeat cbc/bmp at follow up.  Discharge Diagnoses:  Active Hospital Problems   Diagnosis Date Noted  . Acute pyelonephritis 09/07/2018  . UTI (urinary tract infection) 09/07/2018  . E-coli UTI 09/07/2018  . Dehydration 09/07/2018  . Non-intractable vomiting 09/07/2018  . Fever     Resolved Hospital Problems  No resolved problems to display.    Discharge Condition: stable  Diet recommendation: regular diet  Filed Weights   09/07/18 0908 09/08/18 0700 09/09/18 0405  Weight: 56.8 kg 62.6 kg 63.4 kg    History of present illness: (per admitting MD Dr Janee Morn) PCP: Patient, No Pcp Per Patient coming from: Home  I have personally briefly reviewed patient's old medical records in [facility]  Chief Complaint: I have a UTI.  Left sided pain.  HPI: Danielle Ortiz is a 18 y.o. female with medical history significant of depression, occipital neuralgia of the right side who presents to [facility] with excruciating ongoing left flank pain. Patient states she has been having excruciating left flank pain that has been constant and ongoing for 2 days with no radiation with associated chills, nausea and emesis and inability to keep anything down.  Patient states was seen in the ED 1 day prior to admission and that time was diagnosed with a inflammation/infection of her descending colon sent home on oral ciprofloxacin and Flagyl as well as pain medication however unable to keep anything down with ongoing pain and presented back to the ED.  Patient denies any syncopal episodes, no abdominal pain, no diarrhea, no constipation, no dysuria, no melena, no hematemesis, no hematochezia,  no hematuria, no syncopal episode.  Patient states was noted to have a fever 101.8 in the ED.  Patient does endorse some generalized weakness, and lightheadedness.  ED Course: Patient seen in the ED, comprehensive metabolic profile done had a glucose of 100 otherwise was unremarkable.  CBC done with a white count of 12.8 otherwise was unremarkable.  Urinalysis done on 09/06/2018 had small leukocytes, nitrite negative, many bacteria, 11-20 WBCs, urine pregnancy negative.  Urinalysis repeat done on 09/07/2018 showed nitrite negative leukocytes negative, protein of 30, specific gravity 1.025, rare bacteria, WBC 0-5.  CT abdomen and pelvis which was done on 09/07/2018 with concerns for early lobar nephronia/bacterial nephritis, no frank abscess, no hydronephrosis, no renal or ureteral calculus on either side, no urinary bladder wall thickening.  Questionable wall thickening along the lateral mid descending colon seen 1 day prior no longer appreciable.  Negative for diverticulitis.  No bowel obstruction.  No abscess evident in the abdomen or pelvis.  Appendix normal.  Patient given 2 g IV Rocephin in the ED, IV Dilaudid, fluid bolus and hospitalist were called to admit the patient.   Hospital Course:  Principal Problem:   Acute pyelonephritis Active Problems:   UTI (urinary tract infection)   E-coli UTI   Dehydration   Non-intractable vomiting   Fever   Acute left pyelonephritis / pan sensitive E coli UTI:   -Blood culture negative -Received IV Rocephin then changed to Keflex -Leukocytosis  And fever resolved -Clinically improving, left-sided flank pain much improved, no vomiting since in the hospital, she is tolerating diet and oral antibiotics -She is discharged  on oral Keflex, follow-up with PCP in 1 week  Hypokalemia /hypomagnesemia -Replaced  Procedures:  none  Consultations:  none  Discharge Exam: BP 109/71   Pulse (!) 47   Temp 98.4 F (36.9 C) (Oral)   Resp 16   Ht 5\' 3"  (1.6  m)   Wt 63.4 kg   SpO2 95%   BMI 24.76 kg/m   General: NAD Cardiovascular: RRR Respiratory: CTABL  Discharge Instructions You were cared for by a hospitalist during your hospital stay. If you have any questions about your discharge medications or the care you received while you were in the hospital after you are discharged, you can call the unit and asked to speak with the hospitalist on call if the hospitalist that took care of you is not available. Once you are discharged, your primary care physician will handle any further medical issues. Please note that NO REFILLS for any discharge medications will be authorized once you are discharged, as it is imperative that you return to your primary care physician (or establish a relationship with a primary care physician if you do not have one) for your aftercare needs so that they can reassess your need for medications and monitor your lab values.  Discharge Instructions    Diet general   Complete by:  As directed    Increase activity slowly   Complete by:  As directed      Allergies as of 09/09/2018   No Known Allergies     Medication List    STOP taking these medications   ciprofloxacin 500 MG tablet Commonly known as:  CIPRO   metroNIDAZOLE 500 MG tablet Commonly known as:  FLAGYL   naproxen 500 MG tablet Commonly known as:  NAPROSYN     TAKE these medications   cephALEXin 500 MG capsule Commonly known as:  KEFLEX Take 1 capsule (500 mg total) by mouth every 8 (eight) hours for 10 days.   ondansetron 4 MG disintegrating tablet Commonly known as:  ZOFRAN ODT Take 1 tablet (4 mg total) by mouth every 8 (eight) hours as needed for nausea or vomiting.   oxyCODONE-acetaminophen 5-325 MG tablet Commonly known as:  PERCOCET/ROXICET Take 1-2 tablets by mouth every 6 (six) hours as needed for severe pain.      No Known Allergies Follow-up Information    f/u with pcp in one week for hospital discharge follow up. Follow up.     Why:  repeat cbc/bmp at follow up.       [facility] Follow up in 1 week(s).   Why:  hospital discharge follow up, repeat cbc/bmp at follow up. Contact information: [address] [place] [id number] [phone]           The results of significant diagnostics from this hospitalization (including imaging, microbiology, ancillary and laboratory) are listed below for reference.    Significant Diagnostic Studies: X-ray Chest Pa And Lateral  Result Date: 09/07/2018 CLINICAL DATA:  Fever, flank pain, nausea and vomiting EXAM: CHEST - 2 VIEW COMPARISON:  05/30/2015 FINDINGS: The heart size and mediastinal contours are within normal limits. Both lungs are clear. The visualized skeletal structures are unremarkable. Overlying nipple piercings. IMPRESSION: No active cardiopulmonary disease. Electronically Signed   By: Judie PetitM.  Shick M.D.   On: 09/07/2018 19:54   Ct Abdomen Pelvis W Contrast  Result Date: 09/07/2018 CLINICAL DATA:  Nausea and vomiting of lower abdominal pain EXAM: CT ABDOMEN AND PELVIS WITH CONTRAST TECHNIQUE: Multidetector  CT imaging of the abdomen and pelvis was performed using the standard protocol following bolus administration of intravenous contrast. Oral contrast was also administered. CONTRAST:  ISOVUE-300 IOPAMIDOL (ISOVUE-300) INJECTION 61%, 30mL ISOVUE-300 IOPAMIDOL (ISOVUE-300) INJECTION 61% COMPARISON:  September 06, 2018 FINDINGS: Lower chest: There is slight bibasilar atelectasis. No lung base edema or consolidation evident. Hepatobiliary: No focal liver lesions are appreciable. There is mild focal fatty infiltration near the fissure for the ligamentum teres. Gallbladder wall is not appreciably thickened. There is no biliary duct dilatation. Pancreas: No pancreatic mass or inflammatory focus. Spleen: No splenic lesions are evident. Adrenals/Urinary Tract: Adrenals bilaterally appear unremarkable. There is no  hydronephrosis on either side. There is no well-defined renal mass on either side. Note that there is an area of decreased attenuation in the lower pole left kidney region which appears somewhat wedge-shaped. This area measures just over 1 x 1 cm. No similar changes noted elsewhere in the kidneys. There is no evident renal or ureteral calculus. No hydronephrosis on either side. Urinary bladder is midline with wall thickness within normal limits. Stomach/Bowel: The rectum is distended with air. There is no appreciable bowel wall or mesenteric thickening. No changes of diverticulitis are appreciable. There is no appreciable bowel obstruction. No abscess in the abdomen or pelvis. Note that there is no longer mesenteric thickening adjacent to the descending colon in the upper pelvic region on the left as was noted 1 day prior. Vascular/Lymphatic: There is no abdominal aortic aneurysm. No vascular lesions are demonstrable. There are several focal lymph nodes in the left periaortic region at the mid left kidney level, largest measuring 1.2 x 0.8 cm. By size criteria, there is no frank adenopathy in the abdomen or pelvis. Reproductive: Uterus is anteverted.  No evident pelvic mass. Other: Appendix appears unremarkable. No evident abscess or ascites in the abdomen or pelvis. Musculoskeletal: No blastic or lytic bone lesions. No intramuscular or abdominal wall lesions are evident. IMPRESSION: 1. Focal area of decreased attenuation in the lower pole of the left kidney in a somewhat wedge-shaped manner. Question a degree of early lobar nephronia/bacterial nephritis. Appropriate laboratory assessment advised in this regard. No frank abscess. Kidneys otherwise appear normal bilaterally. No hydronephrosis on either side. No renal or ureteral calculus on either side. No urinary bladder wall thickening. 2. Several focal lymph nodes in the left mid periaortic region near the left kidney. Question inflammatory etiology secondary to  the changes in the lower pole left kidney. No frank adenopathy on this study by size criteria. 3. The questionable wall thickening along the lateral mid descending colon seen 1 day prior is no longer appreciable. There are no changes currently suggesting bowel inflammation. No diverticulitis in particular. No bowel obstruction. 4. No abscess evident in the abdomen or pelvis. Appendix appears normal. Electronically Signed   By: Bretta Bang III M.D.   On: 09/07/2018 12:19   Ct Renal Stone Study  Result Date: 09/06/2018 CLINICAL DATA:  Left lower quadrant abdominal pain and nausea since last night. EXAM: CT ABDOMEN AND PELVIS WITHOUT CONTRAST TECHNIQUE: Multidetector CT imaging of the abdomen and pelvis was performed following the standard protocol without IV contrast. COMPARISON:  None. FINDINGS: Lower chest: Clear lung bases. No significant pleural or pericardial effusion. Hepatobiliary: The liver appears unremarkable as imaged in the noncontrast state. No evidence of gallstones, gallbladder wall thickening or biliary dilatation. Pancreas: Unremarkable. No pancreatic ductal dilatation or surrounding inflammatory changes. Spleen: Normal in size without focal abnormality. Adrenals/Urinary Tract: Both adrenal glands  appear normal. There is mildly increased density in the region of the renal pyramids bilaterally, but no discrete renal, ureteral or bladder calculi. There is no hydronephrosis or perinephric soft tissue stranding. The bladder appears unremarkable for its degree of distention. Stomach/Bowel: No evidence of bowel wall thickening or distention. Probable visualization of a normal appearing appendix. There is questionable mild inflammatory changes surrounding the descending colon. No extraluminal fluid collection. Vascular/Lymphatic: There are no enlarged abdominal or pelvic lymph nodes. No significant vascular findings on noncontrast imaging. Reproductive: The uterus and ovaries appear unremarkable. No  adnexal mass. Other: No evidence of abdominal wall mass or hernia. No ascites or free air. Musculoskeletal: No acute or significant osseous findings. IMPRESSION: 1. No evidence of urinary tract calculus or hydronephrosis. 2. Questionable mild soft tissue stranding adjacent to the descending colon. This could reflect mild diverticulitis or appendagitis epiploica. No appreciable bowel wall thickening, bowel obstruction or appendiceal abnormality. Electronically Signed   By: Carey BullocksWilliam  Veazey M.D.   On: 09/06/2018 12:51    Microbiology: Recent Results (from the past 240 hour(s))  Urine culture     Status: Abnormal   Collection Time: 09/06/18 11:46 AM  Result Value Ref Range Status   Specimen Description   Final    URINE, CLEAN CATCH Performed at [facility] [place], [address] [place], [place] [address]    Special Requests   Final    NONE Performed at [facility] [place] [facility], [address] [place], [place] [address]    Culture >=100,000 COLONIES/mL ESCHERICHIA COLI (A)  Final   Report Status 09/08/2018 FINAL  Final   Organism ID, Bacteria ESCHERICHIA COLI (A)  Final      Susceptibility   Escherichia coli - MIC*    AMPICILLIN 4 SENSITIVE Sensitive     CEFAZOLIN <=4 SENSITIVE Sensitive     CEFTRIAXONE <=1 SENSITIVE Sensitive     CIPROFLOXACIN <=0.25 SENSITIVE Sensitive     GENTAMICIN <=1 SENSITIVE Sensitive     IMIPENEM <=0.25 SENSITIVE Sensitive     NITROFURANTOIN <=16 SENSITIVE Sensitive     TRIMETH/SULFA <=20 SENSITIVE Sensitive     AMPICILLIN/SULBACTAM <=2 SENSITIVE Sensitive     PIP/TAZO <=4 SENSITIVE Sensitive     Extended ESBL NEGATIVE Sensitive     * >=100,000 COLONIES/mL ESCHERICHIA COLI  Culture, blood (Routine X 2) w Reflex to ID Panel     Status: None (Preliminary result)   Collection Time: 09/07/18  7:11 PM  Result Value Ref Range Status   Specimen Description BLOOD LEFT ARM  Final   Special Requests   Final    BOTTLES DRAWN AEROBIC AND ANAEROBIC Blood Culture  results may not be optimal due to an excessive volume of blood received in culture bottles Performed at [facility], [address] [place], [place] [address]    Culture   Final    NO GROWTH 2 DAYS Performed at [facility], [address] [place], [place] [address]    Report Status PENDING  Incomplete  Culture, blood (Routine X 2) w Reflex to ID Panel     Status: None (Preliminary result)   Collection Time: 09/07/18  7:11 PM  Result Value Ref Range Status   Specimen Description BLOOD LEFT ARM  Final   Special Requests   Final    BOTTLES DRAWN AEROBIC AND ANAEROBIC Blood Culture results may not be optimal due to an excessive volume of blood received in culture bottles Performed at [facility], [address] [place], [place] [address]    Culture   Final    NO GROWTH 2 DAYS Performed at [facility], [address], [place], [place] [address]    Report Status PENDING  Incomplete     Labs: Basic Metabolic Panel: Recent Labs  Lab 09/06/18 1154 09/07/18 0920 09/07/18 1842 09/08/18 0534  NA 136 136  --  139  K 3.5 3.6  --  3.4*  CL 103 107  --  110  CO2 24 23  --  25  GLUCOSE 108* 100*  --  88  BUN 7 8  --  5*  CREATININE 0.80 0.94  --  0.79  CALCIUM 9.5 8.7*  --  8.0*  MG  --   --  1.6* 2.0   Liver Function Tests: Recent Labs  Lab 09/06/18 1154 09/07/18 0920  AST 16 12*  ALT 12 10  ALKPHOS 53 44  BILITOT 1.2 0.7  PROT 7.9 6.6  ALBUMIN 4.8 4.0   Recent Labs  Lab 09/07/18 0920  LIPASE 22   No results for input(s): AMMONIA in the last 168 hours. CBC: Recent Labs  Lab 09/06/18 1154 09/07/18 0920 09/08/18 0534 09/09/18 0350  WBC 12.2* 12.8* 9.9 7.7  NEUTROABS  --  10.5*  --   --   HGB 14.8 13.1 11.5* 10.8*  HCT 46.0 40.4 35.8* 33.4*  MCV 90.9 91.8 94.0 92.8  PLT 303 226 182 206   Cardiac Enzymes: No results for input(s): CKTOTAL, CKMB, CKMBINDEX, TROPONINI in the last 168 hours. BNP: BNP (last 3  results) No results for input(s): BNP in the last 8760 hours.  ProBNP (last 3 results) No results for input(s): PROBNP in the last 8760 hours.  CBG: No results for input(s): GLUCAP in the last 168 hours.     Signed:  Albertine Grates MD, PhD  [facility] 09/09/2018, 11:26 AM

## 2018-09-09 NOTE — Telephone Encounter (Signed)
Post ED Visit - Positive Culture Follow-up  Culture report reviewed by antimicrobial stewardship pharmacist:  []  Enzo Bi, Pharm.D. []  Celedonio Miyamoto, Pharm.D., BCPS AQ-ID []  Garvin Fila, Pharm.D., BCPS []  Georgina Pillion, Pharm.D., BCPS []  Lares, [place].D., BCPS, AAHIVP []  Estella Husk, Pharm.D., BCPS, AAHIVP [x]  Lysle Pearl, PharmD, BCPS []  Phillips Climes, PharmD, BCPS []  Agapito Games, PharmD, BCPS []  Verlan Friends, PharmD  Positive urine culture Treated with Ciprofloxacin, organism sensitive to the same and no further patient follow-up is required at this time.  Norm Parcel RN 09/09/2018, 10:50 AM

## 2018-09-12 LAB — CULTURE, BLOOD (ROUTINE X 2)
Culture: NO GROWTH
Culture: NO GROWTH

## 2019-02-10 IMAGING — CT CT HEAD W/O CM
3 series · 15 of 47 positions shown, 18 images · non-contrast
Comparison: None.

CLINICAL DATA: Subacute onset of moderate right-sided headache,
exacerbated by light and sound. Nausea. Initial encounter.

EXAM:
CT HEAD WITHOUT CONTRAST
TECHNIQUE: Contiguous axial images were obtained from the base of the skull
through the vertex without intravenous contrast.

[Series 2: head wo · axial · 0.45mm/px · z∈[+1174,+1298]mm · 9 of 30 slices shown, 12 images]
[im 3/30  brain]
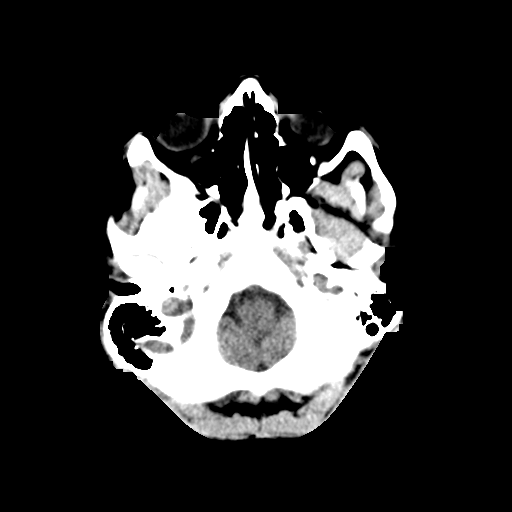
[im 3/30  bone]
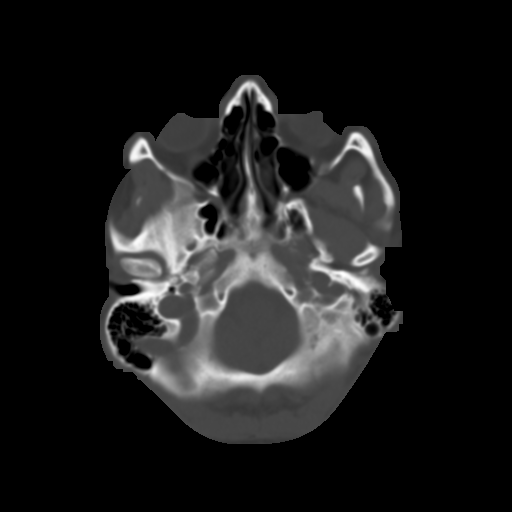
[im 6/30  brain]
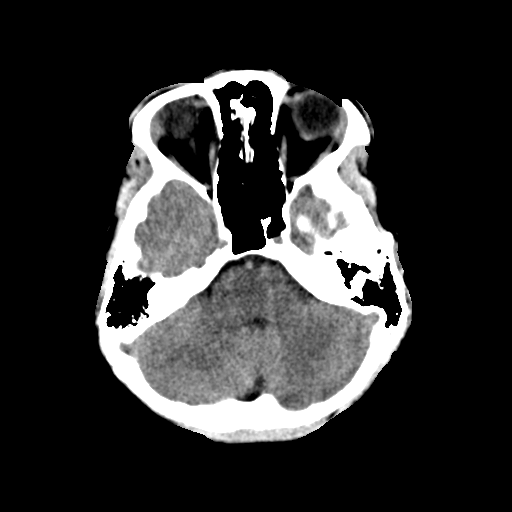
[im 9/30  brain]
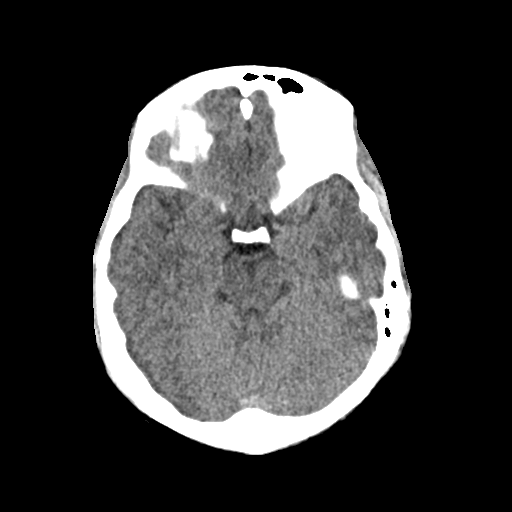
[im 12/30  brain]
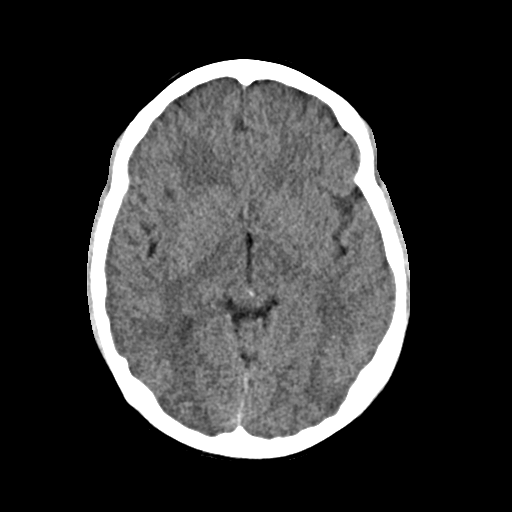
[im 16/30  brain]
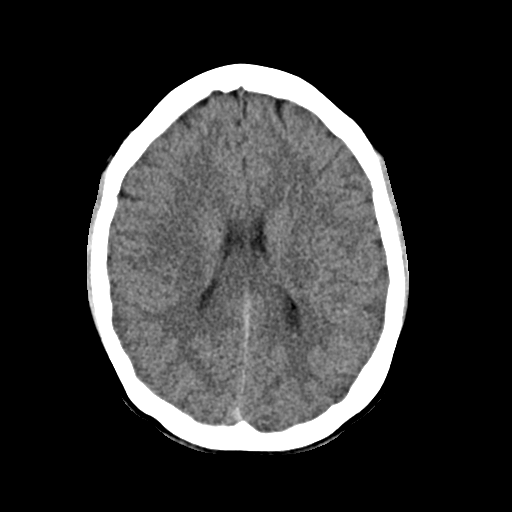
[im 16/30  bone]
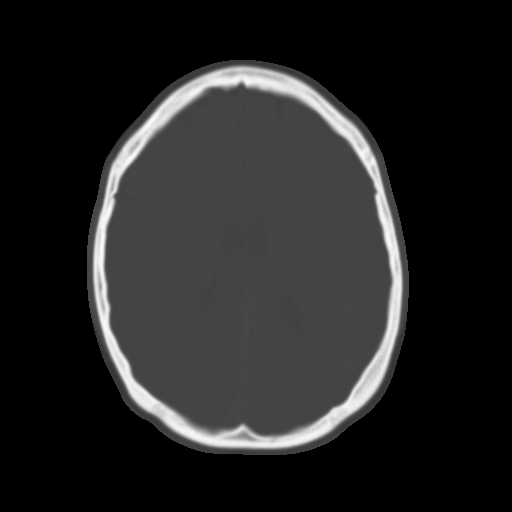
[im 19/30  brain]
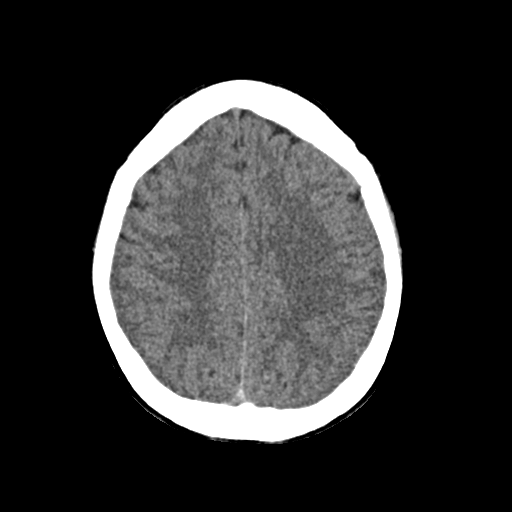
[im 22/30  brain]
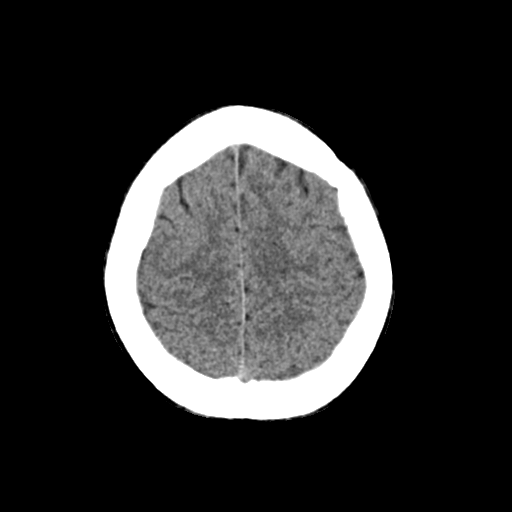
[im 25/30  brain]
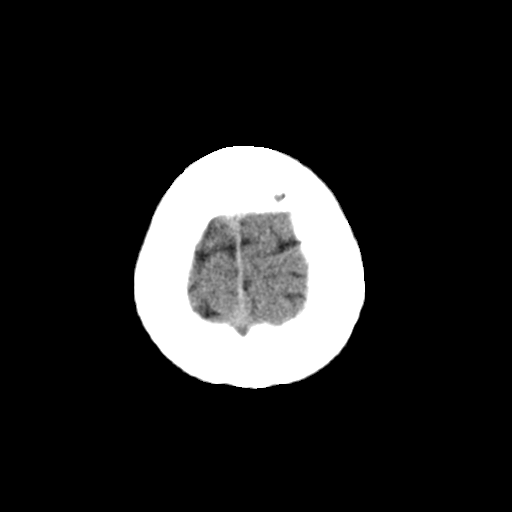
[im 28/30  brain]
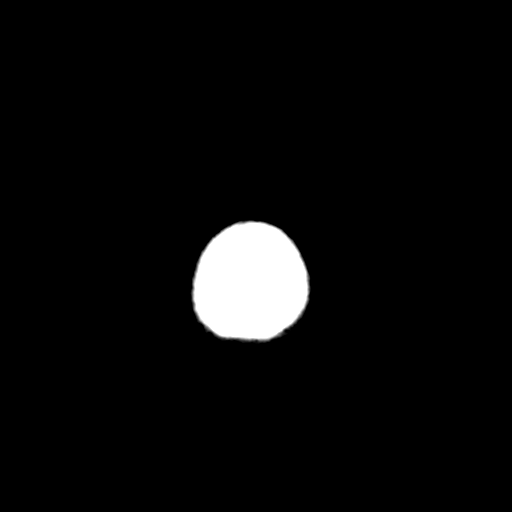
[im 28/30  bone]
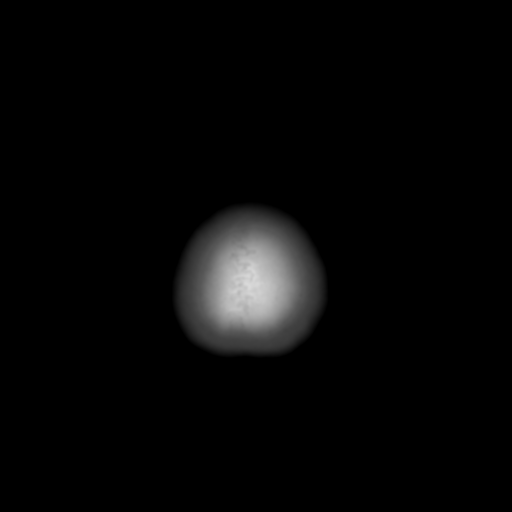

[Series 4: coronal soft · coronal · 0.29mm/px · 3 of 67 slices shown]
[im 23/67  brain]
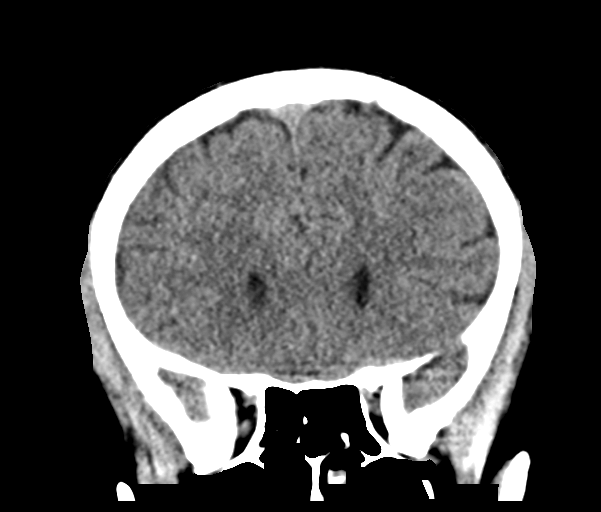
[im 30/67  brain]
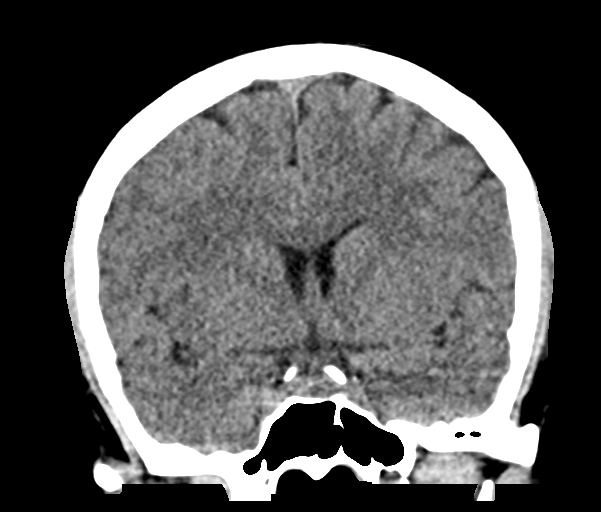
[im 37/67  brain]
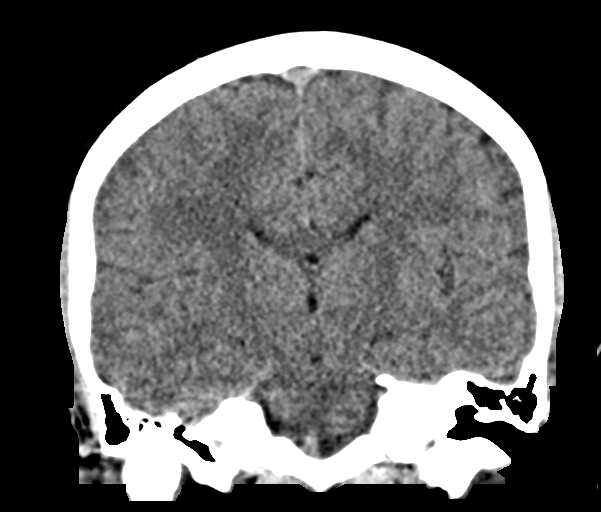

[Series 5: sag soft · sagittal · 0.32mm/px · 3 of 60 slices shown]
[im 20/60  brain]
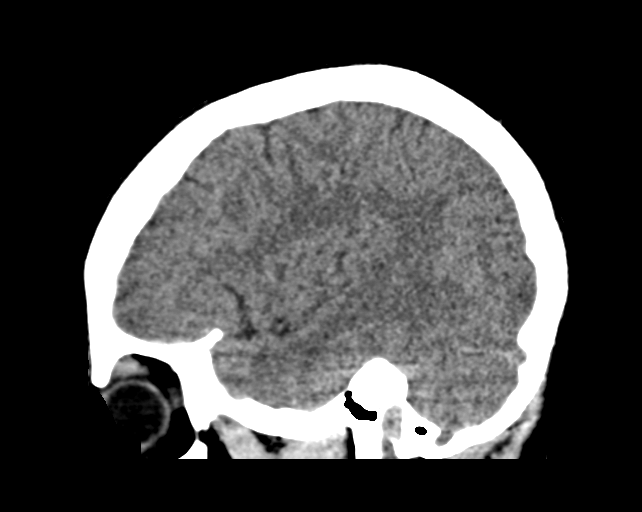
[im 30/60  brain]
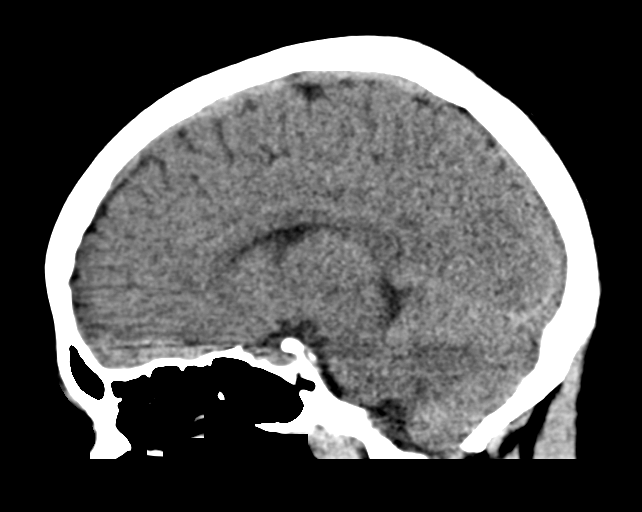
[im 40/60  brain]
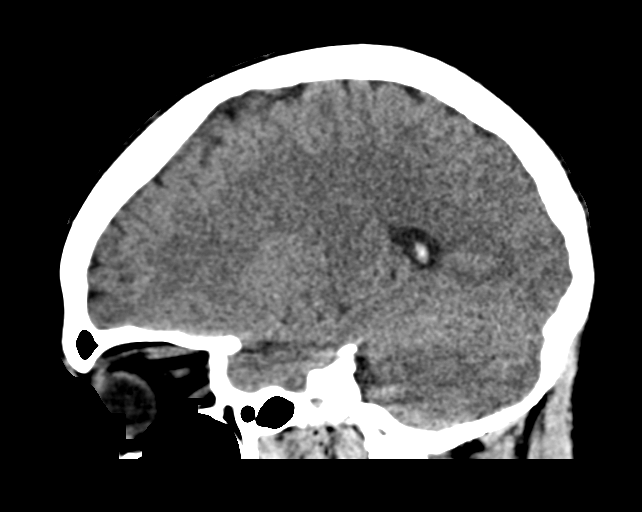

[15 of 47 positions shown; findings below may reference images not displayed]

FINDINGS: Brain: No evidence of acute infarction, hemorrhage, hydrocephalus,
extra-axial collection or mass lesion/mass effect.

The posterior fossa, including the cerebellum, brainstem and fourth
ventricle, is within normal limits. The third and lateral
ventricles, and basal ganglia are unremarkable in appearance. The
cerebral hemispheres are symmetric in appearance, with normal
gray-white differentiation. No mass effect or midline shift is seen.

Vascular: No hyperdense vessel or unexpected calcification.

Skull: There is no evidence of fracture; visualized osseous
structures are unremarkable in appearance.

Sinuses/Orbits: The visualized portions of the orbits are within
normal limits. The paranasal sinuses and mastoid air cells are
well-aerated.

Other: No significant soft tissue abnormalities are seen.
IMPRESSION: Unremarkable noncontrast CT of the head.

## 2019-12-13 ENCOUNTER — Other Ambulatory Visit: Payer: Self-pay

## 2019-12-13 ENCOUNTER — Encounter (HOSPITAL_BASED_OUTPATIENT_CLINIC_OR_DEPARTMENT_OTHER): Payer: Self-pay | Admitting: Emergency Medicine

## 2019-12-13 ENCOUNTER — Emergency Department (HOSPITAL_BASED_OUTPATIENT_CLINIC_OR_DEPARTMENT_OTHER)
Admission: EM | Admit: 2019-12-13 | Discharge: 2019-12-13 | Disposition: A | Discharge status: Home / Self Care | Payer: 59 | Attending: Emergency Medicine | Admitting: Emergency Medicine

## 2019-12-13 DIAGNOSIS — R1030 Lower abdominal pain, unspecified: Secondary | ICD-10-CM | POA: Diagnosis not present

## 2019-12-13 DIAGNOSIS — F17200 Nicotine dependence, unspecified, uncomplicated: Secondary | ICD-10-CM | POA: Diagnosis not present

## 2019-12-13 DIAGNOSIS — F1210 Cannabis abuse, uncomplicated: Secondary | ICD-10-CM | POA: Diagnosis not present

## 2019-12-13 DIAGNOSIS — R109 Unspecified abdominal pain: Secondary | ICD-10-CM

## 2019-12-13 LAB — COMPREHENSIVE METABOLIC PANEL
ALT: 23 U/L (ref 0–44)
AST: 15 U/L (ref 15–41)
Albumin: 4.2 g/dL (ref 3.5–5.0)
Alkaline Phosphatase: 54 U/L (ref 38–126)
Anion gap: 7 (ref 5–15)
BUN: 7 mg/dL (ref 6–20)
CO2: 22 mmol/L (ref 22–32)
Calcium: 9 mg/dL (ref 8.9–10.3)
Chloride: 108 mmol/L (ref 98–111)
Creatinine, Ser: 0.67 mg/dL (ref 0.44–1.00)
GFR calc Af Amer: >60 mL/min (ref >60)
GFR calc non Af Amer: >60 mL/min (ref >60)
Glucose, Bld: 91 mg/dL (ref 70–99)
Potassium: 4.4 mmol/L (ref 3.5–5.1)
Sodium: 137 mmol/L (ref 135–145)
Total Bilirubin: 0.6 mg/dL (ref 0.3–1.2)
Total Protein: 6.6 g/dL (ref 6.5–8.1)

## 2019-12-13 LAB — URINALYSIS, ROUTINE W REFLEX MICROSCOPIC
Bilirubin Urine: NEGATIVE
Glucose, UA: NEGATIVE mg/dL
Hgb urine dipstick: NEGATIVE
Ketones, ur: NEGATIVE mg/dL
Leukocytes,Ua: NEGATIVE
Nitrite: NEGATIVE
Protein, ur: NEGATIVE mg/dL
Specific Gravity, Urine: 1.025 (ref 1.005–1.030)
pH: 6 (ref 5.0–8.0)

## 2019-12-13 LAB — CBC
HCT: 42.8 % (ref 36.0–46.0)
Hemoglobin: 13.5 g/dL (ref 12.0–15.0)
MCH: 28.4 pg (ref 26.0–34.0)
MCHC: 31.5 g/dL (ref 30.0–36.0)
MCV: 89.9 fL (ref 80.0–100.0)
Platelets: 273 10*3/uL (ref 150–400)
RBC: 4.76 MIL/uL (ref 3.87–5.11)
RDW: 14.8 % (ref 11.5–15.5)
WBC: 6.6 10*3/uL (ref 4.0–10.5)
nRBC: 0 % (ref 0.0–0.2)

## 2019-12-13 LAB — WET PREP, GENITAL
Clue Cells Wet Prep HPF POC: NONE SEEN
Sperm: NONE SEEN
Trich, Wet Prep: NONE SEEN
Yeast Wet Prep HPF POC: NONE SEEN

## 2019-12-13 LAB — LIPASE, BLOOD: Lipase: 23 U/L (ref 11–51)

## 2019-12-13 LAB — PREGNANCY, URINE: Preg Test, Ur: NEGATIVE

## 2019-12-13 MED ORDER — IBUPROFEN 400 MG PO TABS
600.0000 mg | ORAL_TABLET | Freq: Once | ORAL | Status: AC
  Administered 2019-12-13: 600 mg via ORAL
  Filled 2019-12-13: qty 1

## 2019-12-13 NOTE — Discharge Instructions (Addendum)
Please continue taking 600 mg Ibuprofen every 6-8 hours as needed for pain. Apply heating pads to your stomach to help with the cramping.   Keep appointment as scheduled with Dr. Ludwig Clarks at the end of the month for further evaluation of your recurrent symptoms.   We have swabbed you for gonorrhea/chlamydia - we will call you in 2-3 days time IF you test positive. You can also log into mychart and check the results that way. If you test positive for either one please return to the ED or the health department for treatment. Let all partners know at that time that you have tested positive and they need to seek treatment as well.

## 2019-12-13 NOTE — ED Notes (Signed)
Pt verbalized understanding of dc instructions.

## 2019-12-13 NOTE — ED Triage Notes (Signed)
Severe abd cramping yesterday. Started her menstrual yesterday.

## 2019-12-13 NOTE — ED Notes (Signed)
ED Provider at bedside. 

## 2019-12-13 NOTE — ED Provider Notes (Signed)
[facility] EMERGENCY DEPARTMENT Provider Note   CSN: [id number] Arrival date & time: 12/13/19  0901     History Chief Complaint  Patient presents with  . Abdominal Pain    Danielle Ortiz is a 19 y.o. female who presents to the ED today complaining of gradual onset, constant, cramping, diffuse lower abdominal pain x 1 day.  She reports she started her menstrual cycle yesterday and began having severe abdominal cramping.  She states she had similar symptoms last month when she had her period as well.  Does mention that she had a BB 01/19 of this year and her first menstrual cycle post delivery was last month.  She states that she dealt with the pain last month however this time it is seems more severe prompting her to come to the ED.  She has not followed up with her OB/GYN regarding the symptoms.  Reports taking over-the-counter medications as well as leftover Percocet without relief.  Patient denies any fevers, chills, nausea, vomiting, diarrhea, vaginal discharge, any other associated symptoms.  She is not concerned about STIs at this time.  She is sexually active with her husband and they are in a monogamous relationship.   The history is provided by the patient and medical records.       Past Medical History:  Diagnosis Date  . Depression   . Medical history non-contributory   . Strep throat     Patient Active Problem List   Diagnosis Date Noted  . Hypokalemia   . Hypomagnesemia   . Acute pyelonephritis 09/07/2018  . UTI (urinary tract infection) 09/07/2018  . E-coli UTI 09/07/2018  . Dehydration 09/07/2018  . Non-intractable vomiting 09/07/2018  . Fever   . Occipital neuralgia of right side 10/14/2016    Past Surgical History:  Procedure Laterality Date  . WISDOM TOOTH EXTRACTION       OB History    Gravida  1   Para  0   Term  0   Preterm  0   AB  0   Living        SAB  0   TAB  0   Ectopic  0   Multiple      Live Births               Family History  Problem Relation Age of Onset  . Migraines Mother   . Migraines Maternal Aunt   . Migraines Maternal Grandmother   . Depression Maternal Grandmother   . Seizures Neg Hx   . Anxiety disorder Neg Hx   . Bipolar disorder Neg Hx   . Schizophrenia Neg Hx   . ADD / ADHD Neg Hx   . Autism Neg Hx     Social History   Tobacco Use  . Smoking status: Current Every Day Smoker  . Smokeless tobacco: Never Used  Substance Use Topics  . Alcohol use: Yes    Comment: occ  . Drug use: Yes    Types: Marijuana    Home Medications Prior to Admission medications   Medication Sig Start Date End Date Taking? Authorizing Provider  ondansetron (ZOFRAN ODT) 4 MG disintegrating tablet Take 1 tablet (4 mg total) by mouth every 8 (eight) hours as needed for nausea or vomiting. 09/06/18   Petrucelli, Pleas Koch, PA-C  oxyCODONE-acetaminophen (PERCOCET/ROXICET) 5-325 MG tablet Take 1-2 tablets by mouth every 6 (six) hours as needed for severe pain. 09/06/18   Petrucelli, Pleas Koch, PA-C  Allergies    Patient has no known allergies.  Review of Systems   Review of Systems  Constitutional: Negative for chills and fever.  Gastrointestinal: Positive for abdominal pain. Negative for constipation, diarrhea, nausea and vomiting.  All other systems reviewed and are negative.   Physical Exam Updated Vital Signs BP 131/90 (BP Location: Left Arm)   Pulse 85   Temp 98.4 F (36.9 C) (Oral)   Resp 16   Ht 5\' 4"  (1.626 m)   Wt 60.9 kg   LMP 12/10/2019   SpO2 99%   BMI 23.05 kg/m   Physical Exam Vitals and nursing note reviewed.  Constitutional:      Appearance: She is not diaphoretic.     Comments: Uncomfortable appearing female, tearful on exam  HENT:     Head: Normocephalic and atraumatic.  Eyes:     Conjunctiva/sclera: Conjunctivae normal.  Cardiovascular:     Rate and Rhythm: Normal rate and regular rhythm.     Heart sounds: Normal heart sounds.  Pulmonary:       Effort: Pulmonary effort is normal.     Breath sounds: Normal breath sounds. No wheezing, rhonchi or rales.  Abdominal:     Palpations: Abdomen is soft.     Tenderness: There is abdominal tenderness in the right lower quadrant, suprapubic area and left lower quadrant. There is no right CVA tenderness, left CVA tenderness, guarding or rebound.  Genitourinary:    Comments: Chaperone present for exam. No rashes, lesions, or tenderness to external genitalia. No erythema, injury, or tenderness to vaginal mucosa. Menstrual disc in place. No adnexal masses, tenderness, or fullness. No CMT, cervical friability, or discharge from cervical os.  Musculoskeletal:     Cervical back: Neck supple.  Skin:    General: Skin is warm and dry.  Neurological:     Mental Status: She is alert.     ED Results / Procedures / Treatments   Labs (all labs ordered are listed, but only abnormal results are displayed) Labs Reviewed  WET PREP, GENITAL - Abnormal; Notable for the following components:      Result Value   WBC, Wet Prep HPF POC FEW (*)    All other components within normal limits  LIPASE, BLOOD  COMPREHENSIVE METABOLIC PANEL  CBC  URINALYSIS, ROUTINE W REFLEX MICROSCOPIC  PREGNANCY, URINE  GC/CHLAMYDIA PROBE AMP ([facility]) NOT AT [facility]    EKG None  Radiology No results found.  Procedures Procedures (including critical care time)  Medications Ordered in ED Medications  ibuprofen (ADVIL) tablet 600 mg (600 mg Oral Given 12/13/19 1141)    ED Course  I have reviewed the triage vital signs and the nursing notes.  Pertinent labs & imaging results that were available during my care of the patient were reviewed by me and considered in my medical decision making (see chart for details).    MDM Rules/Calculators/A&P                      19 year old female who presents to the ED today complaining of severe lower abdominal cramping that started yesterday with her menstrual cycle.   Recently had spontaneous vaginal delivery of her first child on 09/18/2019.  Last menstrual cycle on 3/20 with similar symptoms.  Reports heavier than normal menstrual bleeding since having her child.  Is using a menstrual disc and changes it out approximately every 8 hours.  She denies fevers, chills, nausea, vomiting, diarrhea.  On arrival to the ED patient  is afebrile, nontachycardic and nontachypneic.  She is tearful on exam and appears uncomfortable.  She has diffuse lower abdominal tenderness to palpation however no peritoneal signs.  I doubt acute intra-abdominal infection today.  Given patient had similar symptoms 1 month ago along with her menstrual cycle this may very well be more menstrual changes since having her first child.  Will perform pelvic exam today and reevaluate.  Given bilaterality of pain do not think a pelvic ultrasound will be of much use today.  I am less concern for other intrapelvic etiologies including torsion given same symptoms last month.  She is sexually active with her husband however she is not concerned about STDs currently.  Doubt PID.  Discussed case with attending physician Dr. Clarene Duke who agrees with plan.  Lab work was obtained prior to being seen. CBC without leukocytosis. Hgb stable at 13.5.  Urine preg negative.  CMP without electrolyte abnormalities. LFTs within normal limits. Creatinine 0.67 Lipase 23 U/A without infection today  Pelvic exam performed; pt without CMT or adnexal TTP. Reports diffuse "discomfort" with palpation. Do not feel pt needs pelvic ultrasound at this time. Suspect pt's abdominal cramping is related to her menstrual cycle/uterus contractions. Wet prep negative. Have advised pt follow up with her OBGYN given recurrence of symptoms with her menstrual cycle - have called her OBGYN; it appears the nurse midwife patient was seeing has left the practice however able to obtain an appointment for 4/29. Have encouraged Ibuprofen PRN for pain and  heating pad to area. Pt is in agreement with plan and stable for discharge home.   This note was prepared using Dragon voice recognition software and may include unintentional dictation errors due to the inherent limitations of voice recognition software.  Final Clinical Impression(s) / ED Diagnoses Final diagnoses:  Abdominal cramping    Rx / DC Orders ED Discharge Orders    None       Discharge Instructions     Please continue taking 600 mg Ibuprofen every 6-8 hours as needed for pain. Apply heating pads to your stomach to help with the cramping.   Keep appointment as scheduled with Dr. Ludwig Clarks at the end of the month for further evaluation of your recurrent symptoms.   We have swabbed you for gonorrhea/chlamydia - we will call you in 2-3 days time IF you test positive. You can also log into mychart and check the results that way. If you test positive for either one please return to the ED or the health department for treatment. Let all partners know at that time that you have tested positive and they need to seek treatment as well.        Tanda Rockers, PA-C 12/13/19 1144    Little, Ambrose Finland, MD 12/13/19 1505

## 2019-12-14 LAB — GC/CHLAMYDIA PROBE AMP (~~LOC~~) NOT AT ARMC
Chlamydia: NEGATIVE
Comment: NEGATIVE
Comment: NORMAL
Neisseria Gonorrhea: NEGATIVE

## 2020-07-08 ENCOUNTER — Emergency Department (HOSPITAL_BASED_OUTPATIENT_CLINIC_OR_DEPARTMENT_OTHER)
Admission: EM | Admit: 2020-07-08 | Discharge: 2020-07-09 | Disposition: A | Discharge status: Home / Self Care | Payer: 59 | Attending: Emergency Medicine | Admitting: Emergency Medicine

## 2020-07-08 ENCOUNTER — Encounter (HOSPITAL_BASED_OUTPATIENT_CLINIC_OR_DEPARTMENT_OTHER): Payer: Self-pay | Admitting: *Deleted

## 2020-07-08 ENCOUNTER — Emergency Department (HOSPITAL_BASED_OUTPATIENT_CLINIC_OR_DEPARTMENT_OTHER): Payer: 59

## 2020-07-08 ENCOUNTER — Other Ambulatory Visit: Payer: Self-pay

## 2020-07-08 DIAGNOSIS — F17210 Nicotine dependence, cigarettes, uncomplicated: Secondary | ICD-10-CM | POA: Insufficient documentation

## 2020-07-08 DIAGNOSIS — Y990 Civilian activity done for income or pay: Secondary | ICD-10-CM | POA: Insufficient documentation

## 2020-07-08 DIAGNOSIS — S9781XA Crushing injury of right foot, initial encounter: Secondary | ICD-10-CM | POA: Diagnosis present

## 2020-07-08 DIAGNOSIS — S97111A Crushing injury of right great toe, initial encounter: Secondary | ICD-10-CM | POA: Insufficient documentation

## 2020-07-08 DIAGNOSIS — Y92511 Restaurant or cafe as the place of occurrence of the external cause: Secondary | ICD-10-CM | POA: Diagnosis not present

## 2020-07-08 DIAGNOSIS — W208XXA Other cause of strike by thrown, projected or falling object, initial encounter: Secondary | ICD-10-CM | POA: Diagnosis not present

## 2020-07-08 NOTE — ED Triage Notes (Signed)
Pt reports that a table was dropped across her right great toe today while at work.

## 2020-07-09 MED ORDER — NAPROXEN 250 MG PO TABS
500.0000 mg | ORAL_TABLET | Freq: Once | ORAL | Status: AC
  Administered 2020-07-09: 500 mg via ORAL
  Filled 2020-07-09: qty 2

## 2020-07-09 MED ORDER — NAPROXEN 375 MG PO TABS: ORAL_TABLET | ORAL | 0 refills | Status: DC

## 2020-07-09 NOTE — ED Provider Notes (Signed)
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  She was moving some tables at work yesterday evening about 5 or 6 PM and a table fell on her right foot.  She is now having pain in her proximal right great toe.  She rates his pain as a 4 out of 10, worse with palpation, movement or weightbearing.  There is no associated functional deficit or numbness.   Past Medical History:  Diagnosis Date  . Depression   . Medical history non-contributory   . Strep throat     Past Surgical History:  Procedure Laterality Date  . WISDOM TOOTH EXTRACTION      Family History  Problem Relation Age of Onset  . Migraines Mother   . Migraines Maternal Aunt   . Migraines Maternal Grandmother   . Depression Maternal Grandmother   . Seizures Neg Hx   . Anxiety disorder Neg Hx   . Bipolar disorder Neg Hx   . Schizophrenia Neg Hx   . ADD / ADHD Neg Hx   . Autism Neg Hx     Social History   Tobacco Use  . Smoking status: Current Every Day Smoker  . Smokeless tobacco: Never Used  Vaping Use  . Vaping Use: Every day  Substance Use Topics  . Alcohol use: Yes    Comment: occ  . Drug use: Yes    Types: Marijuana    Prior to Admission medications   Medication Sig Start Date End Date Taking? Authorizing Provider  naproxen (NAPROSYN) 375 MG tablet Take 1 tablet twice daily as needed for pain. 07/09/20   Cass Vandermeulen, Jonny Ruiz, MD    Allergies Patient has no known allergies.   REVIEW OF SYSTEMS  Negative except as noted here or in the History of Present Illness.   PHYSICAL EXAMINATION  Initial Vital Signs Blood pressure (!) 153/76, pulse 60, temperature 98.8 F (37.1 C), temperature source Oral, resp. rate 18, height 5\' 3"  (1.6 m), weight  59.9 kg, last menstrual period 05/15/2020, SpO2 100 %.  Examination General: Well-developed, well-nourished female in no acute distress; appearance consistent with age of record HENT: normocephalic; atraumatic Eyes: Normal appearance Neck: supple Heart: regular rate and rhythm Lungs: clear to auscultation bilaterally Abdomen: soft; nondistended; nontender; bowel sounds present Extremities: No deformity; tenderness of right great toe without ecchymosis or swelling, tendon function intact, toe neurovascularly intact Neurologic: Awake, alert and oriented; motor function intact in all extremities and symmetric; no facial droop Skin: Warm and dry Psychiatric: Normal mood and affect   RESULTS  Summary of this visit's results, reviewed and interpreted by myself:   EKG Interpretation  Date/Time:    Ventricular Rate:    PR Interval:    QRS Duration:   QT Interval:    QTC Calculation:   R Axis:     Text Interpretation:        Laboratory Studies: No results found for this or any previous visit (from the past 24 hour(s)). Imaging Studies: DG Foot Complete Right  Result Date: 07/08/2020 CLINICAL DATA:  Stable dropped across right great toe were EXAM: RIGHT FOOT COMPLETE - 3+ VIEW COMPARISON:  None. FINDINGS: Soft tissue swelling of the first digit. Faint linear lucency along the lateral aspect of  the base of the first distal phalanx is not entirely convincing for a fracture and may reflect vascular channel or projectional finding related to the ossification center though could correlate for point tenderness. No other definitive acute fracture or traumatic osseous injury is identified. IMPRESSION: Soft tissue swelling of the first digit. Questionable linear lucency along the lateral aspect of the base of the first distal phalanx. May reflect vascular channel or projectional finding though could correlate for point tenderness to exclude a nondisplaced fracture. No other definitive fracture or  traumatic osseous injury. Electronically Signed   By: Kreg Shropshire M.D.   On: 07/08/2020 22:40    ED COURSE and MDM  Nursing notes, initial and subsequent vitals signs, including pulse oximetry, reviewed and interpreted by myself.  Vitals:   07/08/20 2212 07/08/20 2214  BP: (!) 153/76   Pulse: 60   Resp: 18   Temp: 98.8 F (37.1 C)   TempSrc: Oral   SpO2: 100%   Weight:  59.9 kg  Height:  5\' 3"  (1.6 m)   Medications  naproxen (NAPROSYN) tablet 500 mg (has no administration in time range)    Radiograph equivocal for fracture.  We will place her in a postop boot and have her do light duty for the next 2 days.  PROCEDURES  Procedures   ED DIAGNOSES     ICD-10-CM   1. Crushing injury of right great toe, initial encounter         I43.329J, MD 07/09/20 [phone]

## 2020-07-09 NOTE — ED Notes (Signed)
Discharge instructions discussed with patient. Verbalized understanding. Departs ED with instruction on follow up care and prescriptions.

## 2020-07-10 ENCOUNTER — Ambulatory Visit: Payer: Self-pay

## 2020-07-10 ENCOUNTER — Other Ambulatory Visit: Payer: Self-pay

## 2020-07-10 ENCOUNTER — Encounter: Payer: Self-pay | Admitting: Family Medicine

## 2020-07-10 ENCOUNTER — Ambulatory Visit (INDEPENDENT_AMBULATORY_CARE_PROVIDER_SITE_OTHER): Payer: Worker's Compensation | Admitting: Family Medicine

## 2020-07-10 VITALS — BP 105/66 | HR 81 | Ht 64.0 in | Wt 132.0 lb

## 2020-07-10 DIAGNOSIS — S92301A Fracture of unspecified metatarsal bone(s), right foot, initial encounter for closed fracture: Secondary | ICD-10-CM | POA: Diagnosis not present

## 2020-07-10 DIAGNOSIS — M79674 Pain in right toe(s): Secondary | ICD-10-CM

## 2020-07-10 NOTE — Patient Instructions (Signed)
Nice to meet you Please try ice  Please take vitamin D and calcium  Please use the boot   Please send me a message in MyChart with any questions or updates.  Please see me back in 3 weeks.   --Dr. Jordan Likes

## 2020-07-10 NOTE — Assessment & Plan Note (Signed)
Findings suggestive of a small fracture or stress change of the metatarsal shaft based on a injury from work on 11/9. -Counseled on supportive care. -Cam walker with a scaphoid pad. -Provided work note. -Could consider further imaging if needed.

## 2020-07-10 NOTE — Progress Notes (Signed)
Danielle Ortiz - 19 y.o. female MRN [id number]  Date of birth: 08-Jan-2001  SUBJECTIVE:  Including CC & ROS.  Chief Complaint  Patient presents with  . Toe Injury    right Great Toe x 07-08-2020    Danielle Ortiz is a 19 y.o. female that is presenting with right foot pain after an injury at work.  The injury occurred on 11/9.  She was seen in the emergency department and placed in a postop shoe.  The postop shoe strap is rubbing over the area that is painful.  She is feeling it over the medial aspect of the first metatarsal..  Independent review of the right foot x-ray from 11/9 shows a possible fracture of the proximal phalanx of the great toe.   Review of Systems See HPI   HISTORY: Past Medical, Surgical, Social, and Family History Reviewed & Updated per EMR.   Pertinent Historical Findings include:  Past Medical History:  Diagnosis Date  . Depression   . Medical history non-contributory   . Strep throat     Past Surgical History:  Procedure Laterality Date  . WISDOM TOOTH EXTRACTION      Family History  Problem Relation Age of Onset  . Migraines Mother   . Migraines Maternal Aunt   . Migraines Maternal Grandmother   . Depression Maternal Grandmother   . Seizures Neg Hx   . Anxiety disorder Neg Hx   . Bipolar disorder Neg Hx   . Schizophrenia Neg Hx   . ADD / ADHD Neg Hx   . Autism Neg Hx     Social History   Socioeconomic History  . Marital status: Single    Spouse name: Not on file  . Number of children: Not on file  . Years of education: Not on file  . Highest education level: Not on file  Occupational History  . Not on file  Tobacco Use  . Smoking status: Current Every Day Smoker  . Smokeless tobacco: Never Used  Vaping Use  . Vaping Use: Every day  Substance and Sexual Activity  . Alcohol use: Yes    Comment: occ  . Drug use: Yes    Types: Marijuana  . Sexual activity: Yes    Birth control/protection: Implant  Other  Topics Concern  . Not on file  Social History Narrative   ** Merged History Encounter **       Danielle Ortiz is not currently in school. She lives with her mother, her step-father, and grandmother.     Social Determinants of Health   Financial Resource Strain:   . Difficulty of Paying Living Expenses: Not on file  Food Insecurity:   . Worried About Programme researcher, broadcasting/film/video in the Last Year: Not on file  . Ran Out of Food in the Last Year: Not on file  Transportation Needs:   . Lack of Transportation (Medical): Not on file  . Lack of Transportation (Non-Medical): Not on file  Physical Activity:   . Days of Exercise per Week: Not on file  . Minutes of Exercise per Session: Not on file  Stress:   . Feeling of Stress : Not on file  Social Connections:   . Frequency of Communication with Friends and Family: Not on file  . Frequency of Social Gatherings with Friends and Family: Not on file  . Attends Religious Services: Not on file  . Active Member of Clubs or Organizations: Not on file  . Attends Banker  Meetings: Not on file  . Marital Status: Not on file  Intimate Partner Violence:   . Fear of Current or Ex-Partner: Not on file  . Emotionally Abused: Not on file  . Physically Abused: Not on file  . Sexually Abused: Not on file     PHYSICAL EXAM:  VS: BP 105/66   Pulse 81   Ht 5\' 4"  (1.626 m)   Wt 132 lb (59.9 kg)   BMI 22.66 kg/m  Physical Exam Gen: NAD, alert, cooperative with exam, well-appearing MSK:  Right foot: Tenderness to palpation of the first metatarsal. Swelling in this area. No ecchymosis. Pain with weightbearing. Neurovascularly intact  Limited ultrasound: Right foot:  No hyperemia appreciated at the proximal phalanx. No hyperemia at the first MTP joint. There is mild increased hyperemia at the metatarsal shaft proximally from the first MTP joint to suggest a nondisplaced fracture.  Summary: Findings suggest fracture of the first metatarsal  shaft.  Ultrasound and interpretation by , MD    ASSESSMENT & PLAN:   Closed fracture of shaft of metatarsal bone of right foot Findings suggestive of a small fracture or stress change of the metatarsal shaft based on a injury from work on 11/9. -Counseled on supportive care. -Cam walker with a scaphoid pad. -Provided work note. -Could consider further imaging if needed.

## 2020-07-31 ENCOUNTER — Ambulatory Visit (INDEPENDENT_AMBULATORY_CARE_PROVIDER_SITE_OTHER): Payer: Worker's Compensation | Admitting: Family Medicine

## 2020-07-31 ENCOUNTER — Ambulatory Visit (HOSPITAL_BASED_OUTPATIENT_CLINIC_OR_DEPARTMENT_OTHER)
Admission: RE | Admit: 2020-07-31 | Discharge: 2020-07-31 | Disposition: A | Discharge status: Home / Self Care | Payer: 59 | Source: Ambulatory Visit | Attending: Family Medicine | Admitting: Family Medicine

## 2020-07-31 ENCOUNTER — Encounter: Payer: Self-pay | Admitting: Family Medicine

## 2020-07-31 ENCOUNTER — Other Ambulatory Visit: Payer: Self-pay

## 2020-07-31 DIAGNOSIS — S92301D Fracture of unspecified metatarsal bone(s), right foot, subsequent encounter for fracture with routine healing: Secondary | ICD-10-CM | POA: Diagnosis present

## 2020-07-31 NOTE — Progress Notes (Signed)
Danielle Ortiz - 19 y.o. female MRN [id number]  Date of birth: [birth year]  SUBJECTIVE:  Including CC & ROS.  Chief Complaint  Patient presents with  . Follow-up    right foot    Danielle Ortiz is a 19 y.o. female that is following up for her right foot pain.  She has gotten improvement with the cam walker.  Still gets pain intermittently..   Review of Systems See HPI   HISTORY: Past Medical, Surgical, Social, and Family History Reviewed & Updated per EMR.   Pertinent Historical Findings include:  Past Medical History:  Diagnosis Date  . Depression   . Medical history non-contributory   . Strep throat     Past Surgical History:  Procedure Laterality Date  . WISDOM TOOTH EXTRACTION      Family History  Problem Relation Age of Onset  . Migraines Mother   . Migraines Maternal Aunt   . Migraines Maternal Grandmother   . Depression Maternal Grandmother   . Seizures Neg Hx   . Anxiety disorder Neg Hx   . Bipolar disorder Neg Hx   . Schizophrenia Neg Hx   . ADD / ADHD Neg Hx   . Autism Neg Hx     Social History   Socioeconomic History  . Marital status: Single    Spouse name: Not on file  . Number of children: Not on file  . Years of education: Not on file  . Highest education level: Not on file  Occupational History  . Not on file  Tobacco Use  . Smoking status: Current Every Day Smoker  . Smokeless tobacco: Never Used  Vaping Use  . Vaping Use: Every day  Substance and Sexual Activity  . Alcohol use: Yes    Comment: occ  . Drug use: Yes    Types: Marijuana  . Sexual activity: Yes    Birth control/protection: Implant  Other Topics Concern  . Not on file  Social History Narrative   ** Merged History Encounter **       Danielle Ortiz is not currently in school. She lives with her mother, her step-father, and grandmother.     Social Determinants of Health   Financial Resource Strain:   . Difficulty of Paying Living Expenses: Not on  file  Food Insecurity:   . Worried About Programme researcher, broadcasting/film/video in the Last Year: Not on file  . Ran Out of Food in the Last Year: Not on file  Transportation Needs:   . Lack of Transportation (Medical): Not on file  . Lack of Transportation (Non-Medical): Not on file  Physical Activity:   . Days of Exercise per Week: Not on file  . Minutes of Exercise per Session: Not on file  Stress:   . Feeling of Stress : Not on file  Social Connections:   . Frequency of Communication with Friends and Family: Not on file  . Frequency of Social Gatherings with Friends and Family: Not on file  . Attends Religious Services: Not on file  . Active Member of Clubs or Organizations: Not on file  . Attends Banker Meetings: Not on file  . Marital Status: Not on file  Intimate Partner Violence:   . Fear of Current or Ex-Partner: Not on file  . Emotionally Abused: Not on file  . Physically Abused: Not on file  . Sexually Abused: Not on file     PHYSICAL EXAM:  VS: BP 114/74   Pulse 78  Ht 5\' 3"  (1.6 m)   Wt 125 lb (56.7 kg)   BMI 22.14 kg/m  Physical Exam Gen: NAD, alert, cooperative with exam, well-appearing MSK:  Right foot: No swelling or ecchymosis. Tenderness to palpation over the distal first metatarsal and first MTP joint. Normal range of motion. No stiffness on exam. Neurovascularly intact     ASSESSMENT & PLAN:   Closed fracture of shaft of metatarsal bone of right foot Initial injury on 11/9.  Has some improvement with the cam walker.  Still notices pain around the inverse MTP joint.   -Counseled on home exercise therapy and supportive care. -Counseled on weaning out of the boot. -Provided work note. -X-ray. -Could consider injection if needed.

## 2020-07-31 NOTE — Patient Instructions (Signed)
Good to see you Please continue the boot for another 2-3 weeks. If you have pain transitioning out of the boot I may need to add additional padding to your regular shoes.  I will call with the results from today   Please send me a message in MyChart with any questions or updates.  Please see me back in 4 weeks. .   --Dr. Jordan Likes

## 2020-07-31 NOTE — Assessment & Plan Note (Addendum)
Initial injury on 11/9.  Has some improvement with the cam walker.  Still notices pain around the inverse MTP joint.   -Counseled on home exercise therapy and supportive care. -Counseled on weaning out of the boot. -Provided work note. -X-ray. -Could consider injection if needed.

## 2020-08-01 ENCOUNTER — Telehealth: Payer: Self-pay | Admitting: Family Medicine

## 2020-08-01 NOTE — Telephone Encounter (Signed)
Informed of results.   Myra Rude, MD [facility] 08/01/2020, 8:12 AM

## 2020-08-28 ENCOUNTER — Other Ambulatory Visit: Payer: Self-pay

## 2020-08-28 ENCOUNTER — Ambulatory Visit (INDEPENDENT_AMBULATORY_CARE_PROVIDER_SITE_OTHER): Payer: Worker's Compensation | Admitting: Family Medicine

## 2020-08-28 DIAGNOSIS — S92301D Fracture of unspecified metatarsal bone(s), right foot, subsequent encounter for fracture with routine healing: Secondary | ICD-10-CM | POA: Diagnosis not present

## 2020-08-28 NOTE — Assessment & Plan Note (Signed)
Initial injury on 11/9.  Occurred while she was at work.  Significant improvement in function and pain today. -Counseled on home exercise therapy and supportive care. -Could consider first ray post bracing -Provided work note. -Can follow-up as needed.

## 2020-08-28 NOTE — Patient Instructions (Signed)
Good to see you Please use ice as needed  Please send me a message in MyChart with any questions or updates.  Please see us back as needed.   --Dr. Nixxon Faria  

## 2020-08-28 NOTE — Progress Notes (Signed)
  Danielle Ortiz - 19 y.o. female MRN [id number]  Date of birth: [birth year]  SUBJECTIVE:  Including CC & ROS.  No chief complaint on file.   Danielle Ortiz is a 19 y.o. female that is following up for her right foot pain following injury at work.  She has done well with little to no pain..   Review of Systems See HPI   HISTORY: Past Medical, Surgical, Social, and Family History Reviewed & Updated per EMR.   Pertinent Historical Findings include:  Past Medical History:  Diagnosis Date  . Depression   . Medical history non-contributory   . Strep throat     Past Surgical History:  Procedure Laterality Date  . WISDOM TOOTH EXTRACTION      Family History  Problem Relation Age of Onset  . Migraines Mother   . Migraines Maternal Aunt   . Migraines Maternal Grandmother   . Depression Maternal Grandmother   . Seizures Neg Hx   . Anxiety disorder Neg Hx   . Bipolar disorder Neg Hx   . Schizophrenia Neg Hx   . ADD / ADHD Neg Hx   . Autism Neg Hx     Social History   Socioeconomic History  . Marital status: Single    Spouse name: Not on file  . Number of children: Not on file  . Years of education: Not on file  . Highest education level: Not on file  Occupational History  . Not on file  Tobacco Use  . Smoking status: Current Every Day Smoker  . Smokeless tobacco: Never Used  Vaping Use  . Vaping Use: Every day  Substance and Sexual Activity  . Alcohol use: Yes    Comment: occ  . Drug use: Yes    Types: Marijuana  . Sexual activity: Yes    Birth control/protection: Implant  Other Topics Concern  . Not on file  Social History Narrative   ** Merged History Encounter **       Danielle Ortiz is not currently in school. She lives with her mother, her step-father, and grandmother.     Social Determinants of Health   Financial Resource Strain: Not on file  Food Insecurity: Not on file  Transportation Needs: Not on file  Physical Activity: Not on  file  Stress: Not on file  Social Connections: Not on file  Intimate Partner Violence: Not on file     PHYSICAL EXAM:  VS: BP 100/67   Ht 5\' 3"  (1.6 m)   Wt 120 lb (54.4 kg)   BMI 21.26 kg/m  Physical Exam Gen: NAD, alert, cooperative with exam, well-appearing MSK:  Right foot: No swelling or ecchymosis. Normal range of motion of the great toe. No tenderness to palpation. Neurovascular intact     ASSESSMENT & PLAN:   Closed fracture of shaft of metatarsal bone of right foot Initial injury on 11/9.  Occurred while she was at work.  Significant improvement in function and pain today. -Counseled on home exercise therapy and supportive care. -Could consider first ray post bracing -Provided work note. -Can follow-up as needed.

## 2020-10-06 ENCOUNTER — Telehealth: Payer: Self-pay

## 2020-10-06 NOTE — Telephone Encounter (Signed)
lvm to schedule appt.  

## 2020-10-06 NOTE — Telephone Encounter (Signed)
Caller states they are needing to make an appointment.  Telephone: [phone]

## 2021-03-24 LAB — HEPATITIS C ANTIBODY: HCV Ab: NEGATIVE

## 2021-03-24 LAB — OB RESULTS CONSOLE ABO/RH: "RH Type ": POSITIVE

## 2021-03-24 LAB — OB RESULTS CONSOLE RUBELLA ANTIBODY, IGM: Rubella: IMMUNE

## 2021-07-20 LAB — OB RESULTS CONSOLE RPR: RPR: NONREACTIVE

## 2021-07-20 LAB — OB RESULTS CONSOLE HEPATITIS B SURFACE ANTIGEN: Hepatitis B Surface Ag: NEGATIVE

## 2021-07-20 LAB — OB RESULTS CONSOLE HIV ANTIBODY (ROUTINE TESTING): HIV: NONREACTIVE

## 2021-08-30 NOTE — L&D Delivery Note (Signed)
Patient complete and pushing. SVD of viable female infant over intact perineum. Nuchal cord non3 . Infant delivered to mom's abdomen. Delayed cord clamping x 1 minute. Cord clamped x 2, cut. Spontaneous cry heard. Weight and Apgars pending. Cord blood obtained. Placenta delivered spontaneously and intact. LUS cleared of clot Vagina inspected.right labial lacerations noted.  EBL: 150 cc Anesthesia: epidural, local

## 2021-09-16 LAB — OB RESULTS CONSOLE GBS: GBS: NEGATIVE

## 2021-09-17 LAB — OB RESULTS CONSOLE GC/CHLAMYDIA
Chlamydia: NEGATIVE
Gonorrhea: NEGATIVE

## 2021-09-19 ENCOUNTER — Inpatient Hospital Stay (HOSPITAL_COMMUNITY)
Admission: AD | Admit: 2021-09-19 | Discharge: 2021-09-21 | DRG: 807 | Disposition: A | Discharge status: Home / Self Care | Payer: No Typology Code available for payment source | Attending: Family Medicine | Admitting: Family Medicine

## 2021-09-19 ENCOUNTER — Encounter (HOSPITAL_COMMUNITY): Payer: Self-pay | Admitting: Family Medicine

## 2021-09-19 ENCOUNTER — Inpatient Hospital Stay (HOSPITAL_COMMUNITY): Payer: No Typology Code available for payment source | Admitting: Anesthesiology

## 2021-09-19 ENCOUNTER — Other Ambulatory Visit: Payer: Self-pay

## 2021-09-19 DIAGNOSIS — O99324 Drug use complicating childbirth: Secondary | ICD-10-CM | POA: Diagnosis not present

## 2021-09-19 DIAGNOSIS — Z87891 Personal history of nicotine dependence: Secondary | ICD-10-CM

## 2021-09-19 DIAGNOSIS — Z20822 Contact with and (suspected) exposure to covid-19: Secondary | ICD-10-CM | POA: Diagnosis present

## 2021-09-19 DIAGNOSIS — O26893 Other specified pregnancy related conditions, third trimester: Secondary | ICD-10-CM | POA: Diagnosis present

## 2021-09-19 DIAGNOSIS — R030 Elevated blood-pressure reading, without diagnosis of hypertension: Secondary | ICD-10-CM | POA: Diagnosis present

## 2021-09-19 DIAGNOSIS — Z3A36 36 weeks gestation of pregnancy: Secondary | ICD-10-CM | POA: Diagnosis not present

## 2021-09-19 LAB — COMPREHENSIVE METABOLIC PANEL
ALT: 16 U/L (ref 0–44)
AST: 17 U/L (ref 15–41)
Albumin: 2.8 g/dL — ABNORMAL LOW (ref 3.5–5.0)
Alkaline Phosphatase: 129 U/L — ABNORMAL HIGH (ref 38–126)
Anion gap: 8 (ref 5–15)
BUN: <5 mg/dL — ABNORMAL LOW (ref 6–20)
CO2: 21 mmol/L — ABNORMAL LOW (ref 22–32)
Calcium: 8.4 mg/dL — ABNORMAL LOW (ref 8.9–10.3)
Chloride: 106 mmol/L (ref 98–111)
Creatinine, Ser: 0.65 mg/dL (ref 0.44–1.00)
GFR, Estimated: >60 mL/min (ref >60)
Glucose, Bld: 90 mg/dL (ref 70–99)
Potassium: 3.4 mmol/L — ABNORMAL LOW (ref 3.5–5.1)
Sodium: 135 mmol/L (ref 135–145)
Total Bilirubin: 0.5 mg/dL (ref 0.3–1.2)
Total Protein: 5.6 g/dL — ABNORMAL LOW (ref 6.5–8.1)

## 2021-09-19 LAB — RESP PANEL BY RT-PCR (FLU A&B, COVID) ARPGX2
Influenza A by PCR: NEGATIVE
Influenza B by PCR: NEGATIVE
SARS Coronavirus 2 by RT PCR: NEGATIVE

## 2021-09-19 LAB — TYPE AND SCREEN
ABO/RH(D): B POS
Antibody Screen: NEGATIVE

## 2021-09-19 LAB — CBC
HCT: 36.7 % (ref 36.0–46.0)
Hemoglobin: 12 g/dL (ref 12.0–15.0)
MCH: 28.9 pg (ref 26.0–34.0)
MCHC: 32.7 g/dL (ref 30.0–36.0)
MCV: 88.4 fL (ref 80.0–100.0)
Platelets: 268 10*3/uL (ref 150–400)
RBC: 4.15 MIL/uL (ref 3.87–5.11)
RDW: 12.9 % (ref 11.5–15.5)
WBC: 11.7 10*3/uL — ABNORMAL HIGH (ref 4.0–10.5)
nRBC: 0 % (ref 0.0–0.2)

## 2021-09-19 MED ORDER — LIDOCAINE HCL (PF) 1 % IJ SOLN
INTRAMUSCULAR | Status: DC | PRN
  Administered 2021-09-19: 10 mL via EPIDURAL

## 2021-09-19 MED ORDER — ONDANSETRON HCL 4 MG/2ML IJ SOLN: 4.0000 mg | INTRAMUSCULAR | Status: DC | PRN

## 2021-09-19 MED ORDER — ONDANSETRON HCL 4 MG PO TABS: 4.0000 mg | ORAL_TABLET | ORAL | Status: DC | PRN

## 2021-09-19 MED ORDER — LACTATED RINGERS IV SOLN: INTRAVENOUS | Status: DC

## 2021-09-19 MED ORDER — ACETAMINOPHEN 325 MG PO TABS: 650.0000 mg | ORAL_TABLET | ORAL | Status: DC | PRN

## 2021-09-19 MED ORDER — MEASLES, MUMPS & RUBELLA VAC IJ SOLR: 0.5000 mL | Freq: Once | INTRAMUSCULAR | Status: DC

## 2021-09-19 MED ORDER — LACTATED RINGERS IV SOLN
500.0000 mL | Freq: Once | INTRAVENOUS | Status: AC
  Administered 2021-09-19: 500 mL via INTRAVENOUS

## 2021-09-19 MED ORDER — IBUPROFEN 600 MG PO TABS
600.0000 mg | ORAL_TABLET | Freq: Four times a day (QID) | ORAL | Status: DC
  Administered 2021-09-19 – 2021-09-21 (×8): 600 mg via ORAL
  Filled 2021-09-19 (×8): qty 1

## 2021-09-19 MED ORDER — DIPHENHYDRAMINE HCL 25 MG PO CAPS: 25.0000 mg | ORAL_CAPSULE | Freq: Four times a day (QID) | ORAL | Status: DC | PRN

## 2021-09-19 MED ORDER — BENZOCAINE-MENTHOL 20-0.5 % EX AERO
1.0000 "application " | INHALATION_SPRAY | CUTANEOUS | Status: DC | PRN
  Administered 2021-09-19: 1 via TOPICAL
  Filled 2021-09-19: qty 56

## 2021-09-19 MED ORDER — EPHEDRINE 5 MG/ML INJ: 10.0000 mg | INTRAVENOUS | Status: DC | PRN

## 2021-09-19 MED ORDER — OXYTOCIN BOLUS FROM INFUSION
333.0000 mL | Freq: Once | INTRAVENOUS | Status: AC
  Administered 2021-09-19: 333 mL via INTRAVENOUS

## 2021-09-19 MED ORDER — PHENYLEPHRINE 40 MCG/ML (10ML) SYRINGE FOR IV PUSH (FOR BLOOD PRESSURE SUPPORT): 80.0000 ug | PREFILLED_SYRINGE | INTRAVENOUS | Status: DC | PRN

## 2021-09-19 MED ORDER — LACTATED RINGERS IV SOLN: 500.0000 mL | INTRAVENOUS | Status: DC | PRN

## 2021-09-19 MED ORDER — TETANUS-DIPHTH-ACELL PERTUSSIS 5-2.5-18.5 LF-MCG/0.5 IM SUSY: 0.5000 mL | PREFILLED_SYRINGE | Freq: Once | INTRAMUSCULAR | Status: DC

## 2021-09-19 MED ORDER — ZOLPIDEM TARTRATE 5 MG PO TABS: 5.0000 mg | ORAL_TABLET | Freq: Every evening | ORAL | Status: DC | PRN

## 2021-09-19 MED ORDER — OXYCODONE-ACETAMINOPHEN 5-325 MG PO TABS: 2.0000 | ORAL_TABLET | ORAL | Status: DC | PRN

## 2021-09-19 MED ORDER — SOD CITRATE-CITRIC ACID 500-334 MG/5ML PO SOLN: 30.0000 mL | ORAL | Status: DC | PRN

## 2021-09-19 MED ORDER — ACETAMINOPHEN 325 MG PO TABS
650.0000 mg | ORAL_TABLET | ORAL | Status: DC | PRN
  Administered 2021-09-20 (×2): 650 mg via ORAL
  Filled 2021-09-19 (×2): qty 2

## 2021-09-19 MED ORDER — DIPHENHYDRAMINE HCL 50 MG/ML IJ SOLN: 12.5000 mg | INTRAMUSCULAR | Status: DC | PRN

## 2021-09-19 MED ORDER — OXYCODONE-ACETAMINOPHEN 5-325 MG PO TABS: 1.0000 | ORAL_TABLET | ORAL | Status: DC | PRN

## 2021-09-19 MED ORDER — COCONUT OIL OIL: 1.0000 "application " | TOPICAL_OIL | Status: DC | PRN

## 2021-09-19 MED ORDER — WITCH HAZEL-GLYCERIN EX PADS: 1.0000 "application " | MEDICATED_PAD | CUTANEOUS | Status: DC | PRN

## 2021-09-19 MED ORDER — FLEET ENEMA 7-19 GM/118ML RE ENEM: 1.0000 | ENEMA | RECTAL | Status: DC | PRN

## 2021-09-19 MED ORDER — SENNOSIDES-DOCUSATE SODIUM 8.6-50 MG PO TABS
2.0000 | ORAL_TABLET | Freq: Every day | ORAL | Status: DC
  Administered 2021-09-20: 2 via ORAL
  Filled 2021-09-19 (×2): qty 2

## 2021-09-19 MED ORDER — PRENATAL MULTIVITAMIN CH
1.0000 | ORAL_TABLET | Freq: Every day | ORAL | Status: DC
  Administered 2021-09-20 – 2021-09-21 (×2): 1 via ORAL
  Filled 2021-09-19 (×2): qty 1

## 2021-09-19 MED ORDER — FENTANYL-BUPIVACAINE-NACL 0.5-0.125-0.9 MG/250ML-% EP SOLN
12.0000 mL/h | EPIDURAL | Status: DC | PRN
  Administered 2021-09-19: 12 mL/h via EPIDURAL
  Filled 2021-09-19: qty 250

## 2021-09-19 MED ORDER — ONDANSETRON HCL 4 MG/2ML IJ SOLN
4.0000 mg | Freq: Four times a day (QID) | INTRAMUSCULAR | Status: DC | PRN
  Administered 2021-09-19: 4 mg via INTRAVENOUS
  Filled 2021-09-19: qty 2

## 2021-09-19 MED ORDER — OXYTOCIN-SODIUM CHLORIDE 30-0.9 UT/500ML-% IV SOLN
2.5000 [IU]/h | INTRAVENOUS | Status: DC
  Filled 2021-09-19: qty 500

## 2021-09-19 MED ORDER — DIBUCAINE (PERIANAL) 1 % EX OINT: 1.0000 "application " | TOPICAL_OINTMENT | CUTANEOUS | Status: DC | PRN

## 2021-09-19 MED ORDER — LIDOCAINE HCL (PF) 1 % IJ SOLN: 30.0000 mL | INTRAMUSCULAR | Status: DC | PRN

## 2021-09-19 MED ORDER — SIMETHICONE 80 MG PO CHEW: 80.0000 mg | CHEWABLE_TABLET | ORAL | Status: DC | PRN

## 2021-09-19 MED ORDER — ERYTHROMYCIN 5 MG/GM OP OINT
TOPICAL_OINTMENT | OPHTHALMIC | Status: AC
  Filled 2021-09-19: qty 1

## 2021-09-19 NOTE — Discharge Summary (Addendum)
Postpartum Discharge Summary     Patient Name: Danielle Ortiz DOB: 2000/10/08 MRN: [id number]  Date of admission: 09/19/2021 Delivery date:09/19/2021  Delivering provider: Donnamae Jude  Date of discharge: 09/21/2021  Admitting diagnosis: Normal labor [O80, Z37.9], preterm labor Intrauterine pregnancy: [redacted]w[redacted]d    Secondary diagnosis:  Principal Problem:   Preterm labor in third trimester with preterm delivery  Additional problems: none    Discharge diagnosis: Preterm Pregnancy Delivered                                              Post partum procedures: none Augmentation: N/A Complications: None  Hospital course: Onset of Labor With Vaginal Delivery      21y.o. yo [id number] admitted in Active Labor on 09/19/2021. Patient had an uncomplicated labor course as follows:  Membrane Rupture Time/Date: 5:17 PM ,09/19/2021   Delivery Method:Vaginal, Spontaneous  Episiotomy: None  Lacerations:  Periurethral  Patient had an uncomplicated postpartum course.  She is ambulating, tolerating a regular diet, passing flatus, and urinating well. Patient is discharged home in stable condition on 09/21/21.  Newborn Data: Birth date:09/19/2021  Birth time:5:42 PM  Gender:Female  Living status:Living  Apgars:8 ,9  [phone]   Magnesium Sulfate received: No BMZ received: No Rhophylac:N/A MMR:N/A T-DaP:Given prenatally Flu: No Transfusion:No  Physical exam  Vitals:   09/20/21 0508 09/20/21 0903 09/20/21 2328 09/21/21 0605  BP: 126/77 111/68 (!) 101/58 116/67  Pulse: (!) 55 60 61 68  Resp: _0 Temp: 98 F (36.7 C) 98.2 F (36.8 C) 98.1 F (36.7 C) 98.1 F (36.7 C)  TempSrc: Oral Oral Oral Oral  SpO2:   99% 98%  Weight:      Height:       General: alert, cooperative, and no distress Lochia: appropriate Uterine Fundus: firm DVT Evaluation: No evidence of DVT seen on physical exam. Labs: Lab Results  Component Value Date   WBC 11.7 (H)  09/19/2021   HGB 12.0 09/19/2021   HCT 36.7 09/19/2021   MCV 88.4 09/19/2021   PLT 268 09/19/2021   CMP Latest Ref Rng & Units 09/19/2021  Glucose 70 - 99 mg/dL 90  BUN 6 - 20 mg/dL <5(L)  Creatinine 0.44 - 1.00 mg/dL 0.65  Sodium 135 - 145 mmol/L 135  Potassium 3.5 - 5.1 mmol/L 3.4(L)  Chloride 98 - 111 mmol/L 106  CO2 22 - 32 mmol/L 21(L)  Calcium 8.9 - 10.3 mg/dL 8.4(L)  Total Protein 6.5 - 8.1 g/dL 5.6(L)  Total Bilirubin 0.3 - 1.2 mg/dL 0.5  Alkaline Phos 38 - 126 U/L 129(H)  AST 15 - 41 U/L 17  ALT 0 - 44 U/L 16   Edinburgh Score: Edinburgh Postnatal Depression Scale Screening Tool 09/20/2021  I have been able to laugh and see the funny side of things. 0  I have looked forward with enjoyment to things. 0  I have blamed myself unnecessarily when things went wrong. 0  I have been anxious or worried for no good reason. 0  I have felt scared or panicky for no good reason. 0  Things have been getting on top of me. 0  I have been so unhappy that I have had difficulty sleeping. 0  I have felt sad or miserable. 0  I have been so unhappy that I  have been crying. 0  The thought of harming myself has occurred to me. 0  Edinburgh Postnatal Depression Scale Total 0     After visit meds:  Allergies as of 09/21/2021   No Known Allergies      Medication List     TAKE these medications    acetaminophen 325 MG tablet Commonly known as: Tylenol Take 2 tablets (650 mg total) by mouth every 4 (four) hours as needed (for pain scale < 4).   ibuprofen 600 MG tablet Commonly known as: ADVIL Take 1 tablet (600 mg total) by mouth every 6 (six) hours.   oxyCODONE 5 MG immediate release tablet Commonly known as: Roxicodone Take 1 tablet (5 mg total) by mouth every 4 (four) hours as needed for severe pain.         Discharge home in stable condition Infant Feeding: Bottle and Breast Infant Disposition:home with mother Discharge instruction: per After Visit Summary and  Postpartum booklet. Activity: Advance as tolerated. Pelvic rest for 6 weeks.  Diet: routine diet Future Appointments:No future appointments. Follow up Visit:  Follow-up Information     Donella Stade, MD Follow up in 4 week(s).   Specialties: Obstetrics and Gynecology, Maternal and Fetal Medicine Why: pp check Contact information: [place] [address] [phone]                Patient to call and schedule follow up with her OB at [facility]  Patient will return to [facility] for postpartum care Please schedule this patient for a In person postpartum visit in 4 weeks with the following provider: Any provider. Additional Postpartum F/U: None    Low risk pregnancy complicated by:  preterm delivery Delivery mode:  Vaginal, Spontaneous  Anticipated Birth Control:   would like Nexplanon  out patient (unable to get inpatient due to private insurance)   09/21/2021 [name], DO  GME ATTESTATION:  I saw and evaluated the patient. I agree with the findings and the plan of care as documented in the residents note and have made all necessary edits.  Renard Matter, MD, MPH OB Fellow, [place] 09/21/2021 8:05 AM

## 2021-09-19 NOTE — Anesthesia Procedure Notes (Signed)
Epidural Patient location during procedure: OB Start time: 09/19/2021 2:36 PM End time: 09/19/2021 2:45 PM  Staffing Anesthesiologist: Lucretia Kern, MD Performed: anesthesiologist   Preanesthetic Checklist Completed: patient identified, IV checked, risks and benefits discussed, monitors and equipment checked, pre-op evaluation and timeout performed  Epidural Patient position: sitting Prep: DuraPrep Patient monitoring: heart rate, continuous pulse ox and blood pressure Approach: midline Location: L3-L4 Injection technique: LOR air  Needle:  Needle type: Tuohy  Needle gauge: 17 G Needle length: 9 cm Needle insertion depth: 4 cm Catheter type: closed end flexible Catheter size: 19 Gauge Catheter at skin depth: 9 cm Test dose: negative  Assessment Events: blood not aspirated, injection not painful, no injection resistance, no paresthesia and negative IV test  Additional Notes Reason for block:procedure for pain

## 2021-09-19 NOTE — Anesthesia Preprocedure Evaluation (Signed)
Anesthesia Evaluation  Patient identified by MRN, date of birth, ID band Patient awake    Reviewed: Allergy & Precautions, H&P , NPO status , Patient's Chart, lab work & pertinent test results  History of Anesthesia Complications Negative for: history of anesthetic complications  Airway Mallampati: II  TM Distance: >3 FB     Dental   Pulmonary neg pulmonary ROS, former smoker,    Pulmonary exam normal        Cardiovascular negative cardio ROS   Rhythm:regular Rate:Normal     Neuro/Psych negative neurological ROS  negative psych ROS   GI/Hepatic negative GI ROS, Neg liver ROS,   Endo/Other  negative endocrine ROS  Renal/GU negative Renal ROS  negative genitourinary   Musculoskeletal   Abdominal   Peds  Hematology negative hematology ROS (+)   Anesthesia Other Findings   Reproductive/Obstetrics (+) Pregnancy                             Anesthesia Physical Anesthesia Plan  ASA: 2  Anesthesia Plan: Epidural   Post-op Pain Management:    Induction:   PONV Risk Score and Plan:   Airway Management Planned:   Additional Equipment:   Intra-op Plan:   Post-operative Plan:   Informed Consent: I have reviewed the patients History and Physical, chart, labs and discussed the procedure including the risks, benefits and alternatives for the proposed anesthesia with the patient or authorized representative who has indicated his/her understanding and acceptance.       Plan Discussed with:   Anesthesia Plan Comments:         Anesthesia Quick Evaluation  

## 2021-09-19 NOTE — Plan of Care (Signed)
Problem: Education: Goal: Knowledge of condition will improve Outcome: Completed/Met   Problem: Activity: Goal: Will verbalize the importance of balancing activity with adequate rest periods Outcome: Completed/Met Goal: Ability to tolerate increased activity will improve Outcome: Completed/Met

## 2021-09-19 NOTE — Lactation Note (Signed)
This note was copied from a baby's chart. Lactation Consultation Note  Patient Name: Danielle Ortiz AQTMA'U Date: 09/19/2021 Reason for consult: L&D Initial assessment;Mother's request;Late-preterm 34-36.6wks;Breastfeeding assistance Age:23 hours  LC reviewed feeding cues and assisted latching infant to breast with signs of milk transfer.  Mom feeding plan EBF for now.  Mom to receive further LC support on the floor.   Maternal Data Has patient been taught Hand Expression?: Yes Does the patient have breastfeeding experience prior to this delivery?: Yes How long did the patient breastfeed?: 1 month Mom prepared for labor not for breastfeeding  Feeding Mother's Current Feeding Choice: Breast Milk  LATCH Score Latch: Repeated attempts needed to sustain latch, nipple held in mouth throughout feeding, stimulation needed to elicit sucking reflex.  Audible Swallowing: Spontaneous and intermittent  Type of Nipple: Everted at rest and after stimulation  Comfort (Breast/Nipple): Soft / non-tender  Hold (Positioning): Assistance needed to correctly position infant at breast and maintain latch.  LATCH Score: 8   Lactation Tools Discussed/Used    Interventions Interventions: Breast feeding basics reviewed;Assisted with latch;Skin to skin;Breast massage;Hand express;Breast compression;Adjust position;Expressed milk;Education  Discharge    Consult Status Consult Status: Follow-up Date: 09/20/21 Follow-up type: In-patient    Danielle Teston  Ortiz 09/19/2021, 6:43 PM

## 2021-09-19 NOTE — Plan of Care (Signed)
Problem: Education: Goal: Knowledge of General Education information will improve Description: Including pain rating scale, medication(s)/side effects and non-pharmacologic comfort measures Outcome: Completed/Met   Problem: Clinical Measurements: Goal: Ability to maintain clinical measurements within normal limits will improve Outcome: Completed/Met Goal: Will remain free from infection Outcome: Completed/Met Goal: Diagnostic test results will improve Outcome: Completed/Met Goal: Respiratory complications will improve Outcome: Completed/Met Goal: Cardiovascular complication will be avoided Outcome: Completed/Met   Problem: Activity: Goal: Risk for activity intolerance will decrease Outcome: Completed/Met   Problem: Elimination: Goal: Will not experience complications related to bowel motility Outcome: Completed/Met Goal: Will not experience complications related to urinary retention Outcome: Completed/Met   Problem: Pain Managment: Goal: General experience of comfort will improve Outcome: Completed/Met   Problem: Safety: Goal: Ability to remain free from injury will improve Outcome: Completed/Met   Problem: Clinical Measurements: Goal: Ability to maintain clinical measurements within normal limits will improve Outcome: Completed/Met Goal: Will remain free from infection Outcome: Completed/Met Goal: Diagnostic test results will improve Outcome: Completed/Met   Problem: Activity: Goal: Risk for activity intolerance will decrease Outcome: Completed/Met   Problem: Nutrition: Goal: Adequate nutrition will be maintained Outcome: Completed/Met   Problem: Elimination: Goal: Will not experience complications related to bowel motility Outcome: Completed/Met Goal: Will not experience complications related to urinary retention Outcome: Completed/Met   Problem: Pain Managment: Goal: General experience of comfort will improve Outcome: Completed/Met   Problem:  Safety: Goal: Ability to remain free from injury will improve Outcome: Completed/Met   Problem: Education: Goal: Knowledge of condition will improve Outcome: Completed/Met   Problem: Activity: Goal: Will verbalize the importance of balancing activity with adequate rest periods Outcome: Completed/Met Goal: Ability to tolerate increased activity will improve Outcome: Completed/Met   Problem: Role Relationship: Goal: Ability to demonstrate positive interaction with newborn will improve Outcome: Completed/Met

## 2021-09-19 NOTE — Lactation Note (Signed)
This note was copied from a baby's chart. Lactation Consultation Note Mom states baby is BF great w/o difficulty. Mom has a 23 yr old that she BF for 2 months. Mom's choice of feeding is Breast/formula. LPI information given and reviewed. Mom states understanding. Discussed w/mom pumping, mom agreed. Mom shown how to use DEBP & how to disassemble, clean, & reassemble parts. Mom knows to pump q3h for 15-20 min.  Mom pumped collecting 2 ml colostrum. Milk storage reviewed. LPI newborn feeding behavior, STS, I&O, supply and demand discussed. Answered questions mom had. Encouraged mom to rest when baby rest. Lactation brochure given.    Patient Name: Boy Sorah Falkenstein [id number] Date: 09/19/2021 Reason for consult: Initial assessment;Late-preterm 34-36.6wks;Infant < 6lbs Age:77 hours  Maternal Data Has patient been taught Hand Expression?: Yes Does the patient have breastfeeding experience prior to this delivery?: Yes How long did the patient breastfeed?: 2 months  Feeding Mother's Current Feeding Choice: Breast Milk and Formula Nipple Type: Nfant Standard Flow (white)  LATCH Score       Type of Nipple: Everted at rest and after stimulation  Comfort (Breast/Nipple): Soft / non-tender         Lactation Tools Discussed/Used Tools: Pump Breast pump type: Double-Electric Breast Pump Pump Education: Setup, frequency, and cleaning;Milk Storage Reason for Pumping: LPI Pumping frequency: Q 3hr Pumped volume: 2 mL  Interventions    Discharge Pump: DEBP WIC Program: Yes  Consult Status Consult Status: Follow-up Date: 09/20/21 Follow-up type: In-patient    Charyl Dancer 09/19/2021, 11:50 PM

## 2021-09-19 NOTE — H&P (Signed)
OBSTETRIC ADMISSION HISTORY AND PHYSICAL  Danielle Ortiz is a 21 y.o. female [phone] with IUP at [redacted]w[redacted]d by LMP consistent with 9 week [place] presenting for SOL. She reports +FMs, no LOF, no VB, no blurry vision, headaches, peripheral edema, or RUQ pain.  She plans on breast and bottle feeding. She is undecided about birth control postpartum.  She received her prenatal care at [facility]. Patient states she was visiting her partner who was admitted at [facility] when her labor started, so she proceeded to stay at [facility] for evaluation.   Dating: By LMP c/w 9 week [place] --->  Estimated Date of Delivery: 10/17/21  Sono:   @[redacted]w[redacted]d , CWD, normal anatomy, cephalic presentation, anterior placental lie, 2442 g, 21% EFW  Prenatal History/Complications:  Anxiety and depression  Hx of gHTN Hx preterm delivery Hx placental abruption at [redacted] weeks gestation Danielle Ortiz use (last 3-4 weeks ago) Nicotine use (vaping)  Past Medical History: Past Medical History:  Diagnosis Date   Depression    Medical history non-contributory    Strep throat     Past Surgical History: Past Surgical History:  Procedure Laterality Date   WISDOM TOOTH EXTRACTION      Obstetrical History: OB History     Gravida  4   Para  2   Term  1   Preterm  1   AB  0   Living  1      SAB  0   IAB  0   Ectopic  0   Multiple      Live Births  1           Social History Social History   Socioeconomic History   Marital status: Single    Spouse name: Not on file   Number of children: Not on file   Years of education: Not on file   Highest education level: Not on file  Occupational History   Not on file  Tobacco Use   Smoking status: Former    Types: Cigarettes   Smokeless tobacco: Never  Vaping Use   Vaping Use: Every day  Substance and Sexual Activity   Alcohol use: Not Currently    Comment: occ   Drug use: Not Currently    Types: Marijuana   Sexual  activity: Yes    Birth control/protection: Implant  Other Topics Concern   Not on file  Social History Narrative   ** Merged History Encounter **       Danielle Ortiz is not currently in school. She lives with her mother, her step-father, and grandmother.     Social Determinants of Health   Financial Resource Strain: Not on file  Food Insecurity: Not on file  Transportation Needs: Not on file  Physical Activity: Not on file  Stress: Not on file  Social Connections: Not on file    Family History: Family History  Problem Relation Age of Onset   Migraines Mother    Migraines Maternal Aunt    Migraines Maternal Grandmother    Depression Maternal Grandmother    Seizures Neg Hx    Anxiety disorder Neg Hx    Bipolar disorder Neg Hx    Schizophrenia Neg Hx    ADD / ADHD Neg Hx    Autism Neg Hx     Allergies: No Known Allergies  Medications Prior to Admission  Medication Sig Dispense Refill Last Dose   naproxen (NAPROSYN) 375 MG tablet Take 1 tablet twice daily as  needed for pain. 20 tablet 0      Review of Systems  All systems reviewed and negative except as stated in HPI  Blood pressure (!) 140/95, pulse 80, temperature 97.8 F (36.6 C), temperature source Oral, resp. rate (!) 21, SpO2 100 %.  General appearance: alert, cooperative, and no distress Lungs: normal work of breathing on room air  Heart: normal rate, warm and well perfused  Abdomen: soft, non-tender, gravid  Extremities: no LE edema or calf tenderness to palpation   Presentation:  Cephalic Fetal monitoring: Baseline 135, moderate variability, + accels, no decels  Uterine activity: Every  3-5 minutes Dilation: 6.5 Effacement (%): 90 Exam by:: Erle Crocker, RN   Prenatal labs: ABO, Rh:  B POS Antibody:  Neg Rubella:  Immune RPR:  Non-reactive  HBsAg:  Non-reactive   HIV:  Non-reactive  GBS:  Negative   1 hr Glucola - 71, normal  Genetic screening - Not done per chart review  Anatomy US  normal  Prenatal Transfer Tool  Maternal Diabetes: No Genetic Screening: Not done per chart review Maternal Ultrasounds/Referrals: Normal Fetal Ultrasounds or other Referrals:  None Maternal Substance Abuse:  Yes:  Type: Smoker, Marijuana Significant Maternal Medications:  None Significant Maternal Lab Results: Group B Strep negative  No results found for this or any previous visit (from the past 24 hour(s)).  Patient Active Problem List   Diagnosis Date Noted   Normal labor 09/19/2021   Closed fracture of shaft of metatarsal bone of right foot 07/10/2020   Hypokalemia    Hypomagnesemia    Acute pyelonephritis 09/07/2018   UTI (urinary tract infection) 09/07/2018   E-coli UTI 09/07/2018   Dehydration 09/07/2018   Non-intractable vomiting 09/07/2018   Fever    Occipital neuralgia of right side 10/14/2016    Assessment/Plan:  Danielle Ortiz is a 21 y.o. G4P1101 at [redacted]w[redacted]d here for SOL.   #Labor: Will continue expectant management, anticipate SVD.  #Pain: PRN #FWB: Cat 1 #ID:  GBS neg #MOF: Breast/formula  #MOC: Undecided  #Circ:  Desires   #Elevated BP: Noted on admit, mild range. No symptoms. Hx of gHTN. Pre-E labs obtained. Will follow up results.   Worthy Rancher, MD  09/19/2021, 2:18 PM

## 2021-09-20 LAB — RPR: RPR Ser Ql: NONREACTIVE

## 2021-09-20 MED ORDER — HYDROCODONE-ACETAMINOPHEN 5-325 MG PO TABS
1.0000 | ORAL_TABLET | Freq: Four times a day (QID) | ORAL | Status: DC | PRN
  Administered 2021-09-20 – 2021-09-21 (×3): 1 via ORAL
  Filled 2021-09-20 (×3): qty 1

## 2021-09-20 NOTE — Clinical Social Work Maternal (Signed)
CLINICAL SOCIAL WORK MATERNAL/CHILD NOTE  Patient Details  Name: Danielle Ortiz MRN: [id number] Date of Birth: 05/15/2001  Date:  2022-07-04  Clinical Social Worker Initiating Note:  Laurey Arrow Date/Time: Initiated:  09/20/21/1306     Child's Name:  Danielle Ortiz   Biological Parents:  Mother, Father   Need for Interpreter:  None   Reason for Referral:  Behavioral Health Concerns, Current Substance Use/Substance Use During Pregnancy   (hx of anx/dep and THC use.)   Address:  [place] [address]    Phone number:  [phone] (home)     Additional phone number: FOB's number is [phone]  Household Members/Support Persons (HM/SP):   Household Member/Support Person 1, Household Member/Support Person 2   HM/SP Name Relationship DOB or Age  HM/SP -[employer] FOB 01/22/1983  HM/SP -[place] son 09/19/19  HM/SP -3        HM/SP -4        HM/SP -5        HM/SP -6        HM/SP -7        HM/SP -8          Natural Supports (not living in the home):  Immediate Family, Extended Family   Professional Supports:     Employment: Animator   Type of Work: Chief Operating Officer   Education:  [employer] school graduate   Homebound arranged:    Museum/gallery curator Resources:  Multimedia programmer    Other Resources:  [employer] (CSW provided MOB information to apply for [employer].)   Cultural/Religious Considerations Which May Impact Care: None reported  Strengths:  Understanding of illness, Ability to meet basic needs  , Pediatrician chosen, Home prepared for child     Psychotropic Medications:         Pediatrician:    [facility]  Pediatrician List:   [facility]      Pediatrician Fax Number:    Risk Factors/Current Problems:  Substance Use  , Mental Health Concerns     Cognitive State:  Alert  , Insightful  , Able  to Concentrate     Mood/Affect:  Comfortable  , Interested  , Happy  , Relaxed     CSW Assessment: CSW met with MOB at in room 419 to complete an assessment for MH hx and SA hx. When CSW arrived MOB was bonding with infant as evidence by engaging in skin to skin; everyone appeared happy and comfortable.CSW was polite, forthcoming, and easy to engage.    CSW asked about MOB's supports and MOB having the support of FOB and MOB's immediate and distant relatives. MOB reports having all essential items to care for infant post discharge and she expressed feeling excited about being a new mother again.   CSW asked about MOB's about SA hx.  MOB reported smoking marijuana throughout her pregnancy to decrease nausea and other pregnancy symptoms.  CSW informed MOB about the hospitals policy about substance use during pregnancy. MOB is aware of 2 drug screens for infant.  MOB aware that infant's UDS and CDS will be monitored by CSW and a report will be to [facility] if warranted.  MOB denied the use of all other illicit substances.  MOB acknowledged CPS hx with her oldest son due to [facility] use during pregnancy.  Per MOB her case was opened and closed.    CSW asked about MOB's MH hx.  MOB acknowledged a hx of anxiety/depression and reported she was dx around age 48.  MOB also acknowledged PP anxiety with MOB's oldest child.  MOB openly shared she reported symptoms to her provider and symptoms subsided.  CSW provided education regarding the baby blues period vs. perinatal mood disorders, discussed treatment and gave resources for mental health follow up if concerns arise.  CSW recommends self-evaluation during the postpartum time period using the New Mom Checklist from Postpartum Progress and encouraged MOB to contact a medical professional if symptoms are noted at any time. MOB presented with insight and awareness and did not present with any acute MH symptoms. CSW assessed for safety and MOB denied SI, HI, and  DV.  MOB reported feeling comfortable seeking additional help if help is needed.    CSW provided review of Sudden Infant Death Syndrome (SIDS) precautions.    CSW identifies no further need for intervention and no barriers to discharge at this time.   CSW Plan/Description:  No Further Intervention Required/No Barriers to Discharge, Sudden Infant Death Syndrome (SIDS) Education, Perinatal Mood and Anxiety Disorder (PMADs) Education, Other Patient/Family Education, [place], Other Information/Referral to [employer], CSW Will Continue to Monitor Umbilical Cord Tissue Drug Screen Results and Make Report if Warranted   Laurey Arrow, MSW, LCSW Clinical Social Work [phone]  Dimple Nanas, LCSW 09/20/2021, 1:10 PM

## 2021-09-20 NOTE — Progress Notes (Signed)
POSTPARTUM PROGRESS NOTE  Subjective: Danielle Ortiz is a 21 y.o. [id number] PPD#1 s/p SVD at [redacted]w[redacted]d.  She reports she doing well. No acute events overnight. She denies any problems with ambulating, voiding or po intake. Denies nausea or vomiting. She has  passed flatus. Pain is moderately controlled.  Has some cramping when she pumps. Discussed taking ibuprofen for uterine cramping Lochia is scant.  Would like baby to be circumcised. Discussed risks. Consented verbally. Consent note in infant's chart  Objective: Blood pressure 126/77, pulse (!) 55, temperature 98 F (36.7 C), temperature source Oral, resp. rate 18, height 5\' 3"  (1.6 m), weight 72.1 kg, SpO2 100 %, unknown if currently breastfeeding.  Physical Exam:  General: alert, cooperative and no distress Chest: no respiratory distress Abdomen: soft, non-tender  Uterine Fundus: firm, appropriately tender Extremities: No calf swelling or tenderness    edema  Recent Labs    09/19/21 1412  HGB 12.0  HCT 36.7    Assessment/Plan: Danielle Ortiz is a 21 y.o. [id number] s/p PPD#1 at [redacted]w[redacted]d.  Routine Postpartum Care: Doing well, pain well-controlled.  -- Continue routine care, lactation support  -- Contraception: considering Nexplanon or pills. Unsure if wants now or at 6 week visit -- Feeding: breast  Dispo: Plan to stay until PPD#2 since evening delivery and also still having some cramping and working on feeding.   Renard Matter, MD, MPH OB Fellow, [facility] for [facility]

## 2021-09-20 NOTE — Lactation Note (Signed)
This note was copied from a baby's chart. Lactation Consultation Note  Patient Name: Danielle Ortiz [id number] Date: 09/20/2021 Reason for consult: Follow-up assessment;Mother's request;Difficult latch;Late-preterm 34-36.6wks;Infant < 6lbs;Breastfeeding assistance Age:23 hours  Mom recent feeding with formula giving 20 ml between 12:30 and 12: 50 pm. Infant bouts of emesis after feeding with Nfant white nipple but with this feeding doing well.   Mom states infant not latching at the breast. Mom to call for latch assistance with next feeding.  Plan 1. To feed based on cues 8-12x 24hr. Mom to offer breast with compression and look for signs of milk transfer.  2. Mom to supplement with EBM first followed by formula with Nfant nipple and pace bottle feeding. LC reviewed LPTI supplementation guide. Mom aware if infant not latching to offer more volume.  3. DEBP q 3hrs for 15 min  All questions answered at the end of the visit.   Maternal Data    Feeding Mother's Current Feeding Choice: Breast Milk and Formula  LATCH Score                    Lactation Tools Discussed/Used Tools: Pump;Flanges Flange Size: 21 (Mom using 24 flanges with nipple pain, reduced to 21 states better fit. Mom aware flange sizes can change with pumping to adjust accordingly.) Breast pump type: Double-Electric Breast Pump Pump Education: Setup, frequency, and cleaning;Milk Storage Reason for Pumping: increase stimulation Pumping frequency: every 3 hrs for 15 min  Interventions Interventions: Breast feeding basics reviewed;Hand express;Breast compression;Expressed milk;DEBP;Coconut oil;Education;LC Psychologist, educational;Visual merchandiser education  Discharge    Consult Status Consult Status: Follow-up Date: 09/21/21 Follow-up type: In-patient    Adalaide Jaskolski  Nicholson-Springer 09/20/2021, 1:39 PM

## 2021-09-20 NOTE — Anesthesia Postprocedure Evaluation (Signed)
Anesthesia Post Note  Patient: Danielle Ortiz  Procedure(s) Performed: AN AD HOC LABOR EPIDURAL     Patient location during evaluation: Mother Baby Anesthesia Type: Epidural Level of consciousness: awake and alert and oriented Pain management: satisfactory to patient Vital Signs Assessment: post-procedure vital signs reviewed and stable Respiratory status: respiratory function stable Cardiovascular status: stable Postop Assessment: no headache, no backache, epidural receding, patient able to bend at knees, no signs of nausea or vomiting, adequate PO intake and able to ambulate Anesthetic complications: no   No notable events documented.  Last Vitals:  Vitals:   09/20/21 0018 09/20/21 0508  BP: 111/65 126/77  Pulse: (!) 59 (!) 55  Resp: 18 18  Temp: 36.9 C 36.7 C  SpO2:      Last Pain:  Vitals:   09/20/21 0700  TempSrc:   PainSc: 4    Pain Goal: Patients Stated Pain Goal: 0 (09/20/21 0700)                 Karleen Dolphin

## 2021-09-21 ENCOUNTER — Other Ambulatory Visit (HOSPITAL_COMMUNITY): Payer: Self-pay

## 2021-09-21 MED ORDER — ACETAMINOPHEN 325 MG PO TABS
650.0000 mg | ORAL_TABLET | ORAL | 0 refills | Status: AC | PRN
  Filled 2021-09-21: qty 60, 5d supply, fill #0

## 2021-09-21 MED ORDER — IBUPROFEN 600 MG PO TABS
600.0000 mg | ORAL_TABLET | Freq: Four times a day (QID) | ORAL | 0 refills | Status: AC
Start: 2021-09-21 — End: ?
  Filled 2021-09-21: qty 30, 8d supply, fill #0

## 2021-09-21 MED ORDER — OXYCODONE HCL 5 MG PO TABS
5.0000 mg | ORAL_TABLET | ORAL | 0 refills | Status: AC | PRN
  Filled 2021-09-21: qty 4, 1d supply, fill #0

## 2021-09-21 NOTE — Lactation Note (Signed)
This note was copied from a baby's chart. Lactation Consultation Note  Patient Name: Danielle Ortiz [id number] Date: 09/21/2021 Reason for consult: Follow-up assessment;Late-preterm 34-36.6wks Age:23 hours P2, LPTI less than 6 lbs at birth with -6% weight loss. LC did not observe a latch at this time,  per mom, she BF infant for 30 minutes and supplemented infant with 30 mls of Similac Neosure with iron using white NfFant nipple.  LC reviewed LPTI infant policy and ask mom to limit total feedings to 30 minutes at the breast and with bottle pace feeding. Per mom, she was excited that she pumped 10 mls , she will offer to infant after she latching infant at the breast for the next feeding and then offer formula. LC discussed with mom engorgement prevention and treatment, how she knows breastfeeding is going well and warning signs: dehydration prevention, how know if baby is getting enough referencing the " [place]". Mom made aware of O/P services, breastfeeding support groups, community resources, and our phone # for post-discharge questions.   Mom feeding plan: 1- Mom will continue to follow LPTI feeding policy and supplement infant with mom's  EBM and then formula. 2- Mom will continue to use her personal Spectra 2 DEBP at home and continue to pump every 3 hours. 3- Mom will follow up with her Peds appointment tomorrow and make an [facility] outpatient appointment as well as participate in the [facility] Breastfeeding Support Group.  Maternal Data    Feeding Mother's Current Feeding Choice: Breast Milk and Formula  LATCH Score                    Lactation Tools Discussed/Used    Interventions    Discharge Discharge Education: Engorgement and breast care;Warning signs for feeding baby;Outpatient Epic message sent Pump: Personal (Spectra 2 DEBP at home.)  Consult Status Date: 09/21/21 Follow-up type: Physician    Vicente Serene 09/21/2021, 3:42 PM

## 2021-09-22 ENCOUNTER — Ambulatory Visit: Payer: Self-pay

## 2021-09-22 NOTE — Lactation Note (Signed)
This note was copied from a baby's chart. Lactation Consultation Note  Patient Name: Danielle Ortiz [id number] Date: 09/22/2021 Reason for consult: Follow-up assessment;Late-preterm 34-36.6wks Age:23 hours  [redacted]w[redacted]d.  Mother holding baby STS and attempted to latch. Mother is supplementing with mother's milk and formula. Feed on demand with cues.  Goal 8-12+ times per day after first 24 hrs.  Reviewed engorgement care and monitoring voids/stools. Suggest calling LC if assistance is needed.   Feeding Mother's Current Feeding Choice: Breast Milk and Formula    Lactation Tools Discussed/Used Tools: Pump Flange Size: 21 Breast pump type: Double-Electric Breast Pump;Manual Reason for Pumping: stimulation and supplementation Pumping frequency:  (q 3 hours) Pumped volume: 35 mL  Interventions Interventions: Education;DEBP  Discharge Discharge Education: Engorgement and breast care;Warning signs for feeding baby Pump: Advised to call insurance company (Does not qualify for Storkpump with [employer] Medicaid)  Consult Status Consult Status: Complete Date: 09/22/21    Vivianne Master [facility] [place] 09/22/2021, 9:30 AM

## 2021-10-03 ENCOUNTER — Telehealth (HOSPITAL_COMMUNITY): Payer: Self-pay | Admitting: *Deleted

## 2021-10-03 NOTE — Telephone Encounter (Signed)
Attempted hospital discharge follow-up call. Left message for patient to return RN call. Deforest Hoyles, RN, 10/03/21, [phone]

## 2022-12-13 ENCOUNTER — Encounter: Payer: Self-pay | Admitting: *Deleted

## 2023-06-08 ENCOUNTER — Other Ambulatory Visit: Payer: Self-pay

## 2023-06-08 ENCOUNTER — Emergency Department (HOSPITAL_BASED_OUTPATIENT_CLINIC_OR_DEPARTMENT_OTHER): Payer: Medicaid Other

## 2023-06-08 ENCOUNTER — Emergency Department (HOSPITAL_BASED_OUTPATIENT_CLINIC_OR_DEPARTMENT_OTHER)
Admission: EM | Admit: 2023-06-08 | Discharge: 2023-06-08 | Disposition: A | Discharge status: Home / Self Care | Payer: Medicaid Other | Attending: Emergency Medicine | Admitting: Emergency Medicine

## 2023-06-08 ENCOUNTER — Encounter (HOSPITAL_BASED_OUTPATIENT_CLINIC_OR_DEPARTMENT_OTHER): Payer: Self-pay | Admitting: Emergency Medicine

## 2023-06-08 DIAGNOSIS — S60041A Contusion of right ring finger without damage to nail, initial encounter: Secondary | ICD-10-CM | POA: Insufficient documentation

## 2023-06-08 DIAGNOSIS — M79644 Pain in right finger(s): Secondary | ICD-10-CM | POA: Diagnosis present

## 2023-06-08 DIAGNOSIS — X58XXXA Exposure to other specified factors, initial encounter: Secondary | ICD-10-CM | POA: Diagnosis not present

## 2023-06-08 MED ORDER — CYCLOBENZAPRINE HCL 5 MG PO TABS
5.0000 mg | ORAL_TABLET | Freq: Once | ORAL | Status: AC
Start: 2023-06-08 — End: 2023-06-08
  Administered 2023-06-08: 5 mg via ORAL
  Filled 2023-06-08: qty 1

## 2023-06-08 MED ORDER — CYCLOBENZAPRINE HCL 10 MG PO TABS: 10.0000 mg | ORAL_TABLET | Freq: Two times a day (BID) | ORAL | 0 refills | Status: AC | PRN

## 2023-06-08 NOTE — ED Triage Notes (Signed)
Pt c/o right ring finger pain with swelling and bruising.

## 2023-06-08 NOTE — Discharge Instructions (Signed)
You have been seen today for your complaint of right ring finger injury. Your imaging was negative for fracture. Your discharge medications include flexeril. This is a muscle relaxer. It may cause drowsiness. Do not drive, operate heavy machinery or make important decisions when taking this medication. Only take it at night until you know how it affects you. Only take it as needed and take other medications such as ibuprofen or tylenol prior to trying this medication. Follow up with: your PCP in one week for reevaluation Please seek immediate medical care if you develop any of the following symptoms: You lose feeling in your finger. Your finger turns blue. Your finger feels colder than normal when you touch it. At this time there does not appear to be the presence of an emergent medical condition, however there is always the potential for conditions to change. Please read and follow the below instructions.  Do not take your medicine if  develop an itchy rash, swelling in your mouth or lips, or difficulty breathing; call 911 and seek immediate emergency medical attention if this occurs.  You may review your lab tests and imaging results in their entirety on your MyChart account.  Please discuss all results of fully with your primary care provider and other specialist at your follow-up visit.  Note: Portions of this text may have been transcribed using voice recognition software. Every effort was made to ensure accuracy; however, inadvertent computerized transcription errors may still be present.

## 2023-06-08 NOTE — ED Provider Notes (Signed)
[facility] Provider Note   CSN: [id number] Arrival date & time: 06/08/23  2142     History  Chief Complaint  Patient presents with   Finger Injury    Danielle Ortiz is a 22 y.o. female.  With history of depression presenting to the ED for evaluation of right ring finger pain.  She states she was wrestling with her husband yesterday evening when she somehow injured her finger.  She is unsure how exactly she injured it.  She states she had some pain yesterday, but tried managing with Tylenol and ice.  She states the pain has progressively gotten worse today.  She also reports some bruising to the finger.  Pain is localized mostly to the PIP joint of the finger.  She denies any numbness, weakness or tingling.  She denies any pain anywhere else.  HPI     Home Medications Prior to Admission medications   Medication Sig Start Date End Date Taking? Authorizing Provider  cyclobenzaprine (FLEXERIL) 10 MG tablet Take 1 tablet (10 mg total) by mouth 2 (two) times daily as needed for muscle spasms. 06/08/23  Yes Minda Faas, Edsel Petrin, PA-C  acetaminophen (TYLENOL) 325 MG tablet Take 2 tablets (650 mg total) by mouth every 4 (four) hours as needed (for pain scale < 4). 09/21/21   Erick Alley, DO  ibuprofen (ADVIL) 600 MG tablet Take 1 tablet (600 mg total) by mouth every 6 (six) hours. 09/21/21   Erick Alley, DO  oxyCODONE (ROXICODONE) 5 MG immediate release tablet Take 1 tablet (5 mg total) by mouth every 4 (four) hours as needed for severe pain. 09/21/21   Erick Alley, DO      Allergies    Patient has no known allergies.    Review of Systems   Review of Systems  Musculoskeletal:  Positive for arthralgias.  All other systems reviewed and are negative.   Physical Exam Updated Vital Signs BP 128/88 (BP Location: Left Arm)   Pulse 65   Temp 97.7 F (36.5 C)   Resp 18   Ht 5\' 4"  (1.626 m)   Wt 61.2 kg   SpO2 97%   BMI 23.17  kg/m  Physical Exam Vitals and nursing note reviewed.  Constitutional:      General: She is not in acute distress.    Appearance: Normal appearance. She is normal weight. She is not ill-appearing.  HENT:     Head: Normocephalic and atraumatic.  Pulmonary:     Effort: Pulmonary effort is normal. No respiratory distress.  Abdominal:     General: Abdomen is flat.  Musculoskeletal:        General: Normal range of motion.     Cervical back: Neck supple.     Comments: Mild swelling to the PIP joint of the right index finger with overlying bruising.  No erythema.  Sensation intact to all digits bilaterally.  Capillary refill normal.  She is able to flex and extend the digit however decreased range of motion secondary to pain.  Skin:    General: Skin is warm and dry.  Neurological:     Mental Status: She is alert and oriented to person, place, and time.  Psychiatric:        Mood and Affect: Mood normal.        Behavior: Behavior normal.     ED Results / Procedures / Treatments   Labs (all labs ordered are listed, but only abnormal results are displayed)  Labs Reviewed - No data to display  EKG None  Radiology DG Finger Ring Right  Result Date: 06/08/2023 CLINICAL DATA:  Finger injury. Fourth finger pain, swelling and bruising. EXAM: RIGHT RING FINGER 2+V COMPARISON:  Hand radiograph 06/07/2017 FINDINGS: There is no evidence of fracture or dislocation. Chronic corticated density about the volar proximal interphalangeal joint. There is no evidence of arthropathy or other focal bone abnormality. Mild soft tissue edema. IMPRESSION: Mild soft tissue edema. No acute fracture or dislocation. Electronically Signed   By: Narda Rutherford M.D.   On: 06/08/2023 22:28    Procedures Procedures    Medications Ordered in ED Medications  cyclobenzaprine (FLEXERIL) tablet 5 mg (has no administration in time range)    ED Course/ Medical Decision Making/ A&P                                  Medical Decision Making Amount and/or Complexity of Data Reviewed Radiology: ordered.   This patient presents to the ED for concern of finger pain, this involves an extensive number of treatment options, and is a complaint that carries with it a high risk of complications and morbidity.  The differential diagnosis includes fracture, strain, sprain, contusion, dislocation  My initial workup includes imaging, symptom control  Additional history obtained from: Nursing notes from this visit.  I ordered imaging studies including x-ray right ring finger I independently visualized and interpreted imaging which showed negative I agree with the radiologist interpretation  Afebrile, hemodynamically stable.  22 year old female presenting for evaluation of pain to the right index finger.  On exam, there is some swelling and bruising to the PIP joint.  Neurovascular status is intact.  She is able to move the digit in flexion and extension however reduced range of motion secondary to pain.  Imaging negative for fracture.  Overall suspect a contusion.  Fingers were buddy taped here in the emergency department that she cannot fully extend the digit without pain.  This appears to be traumatic injury.  There does not appear to be any infectious etiology and my suspicion for flexor tenosynovitis is low.  She sent a prescription for Flexeril for her pain.  She was encouraged to alternate Tylenol and ibuprofen.  She was given return precautions.  Stable at discharge.  At this time there does not appear to be any evidence of an acute emergency medical condition and the patient appears stable for discharge with appropriate outpatient follow up. Diagnosis was discussed with patient who verbalizes understanding of care plan and is agreeable to discharge. I have discussed return precautions with patient who verbalizes understanding. Patient encouraged to follow-up with their PCP within 1 week. All questions  answered.  Note: Portions of this report may have been transcribed using voice recognition software. Every effort was made to ensure accuracy; however, inadvertent computerized transcription errors may still be present.        Final Clinical Impression(s) / ED Diagnoses Final diagnoses:  Contusion of right ring finger without damage to nail, initial encounter    Rx / DC Orders ED Discharge Orders          Ordered    cyclobenzaprine (FLEXERIL) 10 MG tablet  2 times daily PRN        06/08/23 2250              Michelle Piper, PA-C 06/08/23 2255    Melene Plan, DO 06/08/23 2257

## 2023-10-04 ENCOUNTER — Other Ambulatory Visit: Payer: Self-pay

## 2023-10-04 ENCOUNTER — Emergency Department (HOSPITAL_BASED_OUTPATIENT_CLINIC_OR_DEPARTMENT_OTHER)
Admission: EM | Admit: 2023-10-04 | Discharge: 2023-10-04 | Disposition: A | Discharge status: Home / Self Care | Payer: Medicaid Other | Attending: Emergency Medicine | Admitting: Emergency Medicine

## 2023-10-04 ENCOUNTER — Encounter (HOSPITAL_BASED_OUTPATIENT_CLINIC_OR_DEPARTMENT_OTHER): Payer: Self-pay | Admitting: Emergency Medicine

## 2023-10-04 DIAGNOSIS — W228XXA Striking against or struck by other objects, initial encounter: Secondary | ICD-10-CM | POA: Diagnosis not present

## 2023-10-04 DIAGNOSIS — S8990XA Unspecified injury of unspecified lower leg, initial encounter: Secondary | ICD-10-CM | POA: Diagnosis present

## 2023-10-04 DIAGNOSIS — Z79899 Other long term (current) drug therapy: Secondary | ICD-10-CM | POA: Diagnosis not present

## 2023-10-04 DIAGNOSIS — S80211A Abrasion, right knee, initial encounter: Secondary | ICD-10-CM | POA: Insufficient documentation

## 2023-10-04 DIAGNOSIS — L03115 Cellulitis of right lower limb: Secondary | ICD-10-CM | POA: Diagnosis not present

## 2023-10-04 MED ORDER — CEPHALEXIN 500 MG PO CAPS: 500.0000 mg | ORAL_CAPSULE | Freq: Four times a day (QID) | ORAL | 0 refills | Status: AC

## 2023-10-04 MED ORDER — BENZOCAINE 20 % MT AERO
INHALATION_SPRAY | Freq: Once | OROMUCOSAL | Status: AC
  Filled 2023-10-04: qty 57

## 2023-10-04 NOTE — ED Notes (Signed)
Dressing applied w non stick bandage per EDP approval

## 2023-10-04 NOTE — ED Triage Notes (Signed)
States has a  since sat  last at rug burn on  left lower leg  per pt getting worse ,  last tetanus last year

## 2023-10-04 NOTE — ED Provider Notes (Signed)
 [place] [facility] Provider Note   CSN: [id number] Arrival date & time: 10/04/23  1246     History  Chief Complaint  Patient presents with   Leg Injury    Danielle Ortiz is a 23 y.o. female.  With a history of depression presenting to the ED for evaluation of wound to the right leg.  She states she developed a rug burn approximately 3 days ago.  Pain to the area has persisted but this morning she noticed increasing redness around the area.  She is concerned for infection.  She denies any fevers, chills, nausea, vomiting.  No knee joint pain.  HPI     Home Medications Prior to Admission medications   Medication Sig Start Date End Date Taking? Authorizing Provider  cephALEXin  (KEFLEX ) 500 MG capsule Take 1 capsule (500 mg total) by mouth 4 (four) times daily. 10/04/23  Yes Dameshia Seybold, Marsa HERO, PA-C  acetaminophen  (TYLENOL ) 325 MG tablet Take 2 tablets (650 mg total) by mouth every 4 (four) hours as needed (for pain scale < 4). 09/21/21   Joshua Domino, DO  cyclobenzaprine  (FLEXERIL ) 10 MG tablet Take 1 tablet (10 mg total) by mouth 2 (two) times daily as needed for muscle spasms. 06/08/23   Sagar Tengan, Marsa HERO, PA-C  ibuprofen  (ADVIL ) 600 MG tablet Take 1 tablet (600 mg total) by mouth every 6 (six) hours. 09/21/21   Joshua Domino, DO  oxyCODONE  (ROXICODONE ) 5 MG immediate release tablet Take 1 tablet (5 mg total) by mouth every 4 (four) hours as needed for severe pain. 09/21/21   Joshua Domino, DO      Allergies    Patient has no known allergies.    Review of Systems   Review of Systems  Skin:  Positive for wound.  All other systems reviewed and are negative.   Physical Exam Updated Vital Signs BP (!) 151/85 (BP Location: Right Arm)   Pulse 100   Temp 99.1 F (37.3 C) (Oral)   Resp 18   Ht 5' 3 (1.6 m)   Wt 63.5 kg   SpO2 97%   BMI 24.80 kg/m  Physical Exam Vitals and nursing note reviewed.  Constitutional:      General:  She is not in acute distress.    Appearance: Normal appearance. She is normal weight. She is not ill-appearing.  HENT:     Head: Normocephalic and atraumatic.  Pulmonary:     Effort: Pulmonary effort is normal. No respiratory distress.  Abdominal:     General: Abdomen is flat.  Musculoskeletal:        General: Normal range of motion.     Cervical back: Neck supple.  Skin:    General: Skin is warm and dry.     Comments: 3 cm x 3 cm superficial abrasion to the medial aspect of the right knee with mild surrounding erythema.  Erythema extends beyond markings the patient placed on her skin this morning.  Negative Nikolsky sign.  No purulent drainage.  Neurological:     Mental Status: She is alert and oriented to person, place, and time.  Psychiatric:        Mood and Affect: Mood normal.        Behavior: Behavior normal.     ED Results / Procedures / Treatments   Labs (all labs ordered are listed, but only abnormal results are displayed) Labs Reviewed - No data to display  EKG None  Radiology No results found.  Procedures Procedures    Medications Ordered in ED Medications  Benzocaine  (HURRCAINE) 20 % mouth spray ( Mouth/Throat Given 10/04/23 1545)    ED Course/ Medical Decision Making/ A&P                                 Medical Decision Making This patient presents to the ED for concern of right lower extremity wound, this involves an extensive number of treatment options, and is a complaint that carries with it a high risk of complications and morbidity.  The differential diagnosis includes cellulitis, TEN, SJS, SSSS  Additional history obtained from: Nursing notes from this visit.  Afebrile, hemodynamically stable.  23 year old female presenting to the ED for evaluation of wound to the right leg overlying the medial aspect of the knee.  This occurred a few days ago.  Erythema has progressed.  Pain has progressed as well.  Pain is superficial, does not involve the knee  joint.  On exam, there is a 3 cm x 3 cm abrasion with some surrounding erythema which extends past the marker lines the patient drew this morning. New wound edges were outlined.  Patient was started on Keflex  for nonpurulent cellulitis.  She was encouraged to follow-up with a primary care provider in 1 week for reevaluation of her symptoms.  She was given return precautions.  Stable at discharge.  At this time there does not appear to be any evidence of an acute emergency medical condition and the patient appears stable for discharge with appropriate outpatient follow up. Diagnosis was discussed with patient who verbalizes understanding of care plan and is agreeable to discharge. I have discussed return precautions with patient who verbalizes understanding. Patient encouraged to follow-up with their PCP within 1 week. All questions answered.  Note: Portions of this report may have been transcribed using voice recognition software. Every effort was made to ensure accuracy; however, inadvertent computerized transcription errors may still be present.         Final Clinical Impression(s) / ED Diagnoses Final diagnoses:  Cellulitis of right lower extremity  Abrasion of right knee, initial encounter    Rx / DC Orders ED Discharge Orders          Ordered    cephALEXin  (KEFLEX ) 500 MG capsule  4 times daily        10/04/23 1548              Edwardo Marsa HERO, DEVONNA 10/04/23 1556    Jerral Meth, MD 10/04/23 1800

## 2023-10-04 NOTE — Discharge Instructions (Addendum)
 You have been seen today for your complaint of right leg wound.  This appears consistent with cellulitis which is a skin infection. Your discharge medications include Keflex . This is an antibiotic. You should take it as prescribed. You should take it for the entire duration of the prescription. This may cause an upset stomach. This is normal. You may take this with food. You may also eat yogurt to prevent diarrhea. Follow up with: A primary care provider in 1 week for reevaluation Please seek immediate medical care if you develop any of the following symptoms: You notice red streaks coming from the infected area. You notice the skin turns purple or black and falls off. At this time there does not appear to be the presence of an emergent medical condition, however there is always the potential for conditions to change. Please read and follow the below instructions.  Do not take your medicine if  develop an itchy rash, swelling in your mouth or lips, or difficulty breathing; call 911 and seek immediate emergency medical attention if this occurs.  You may review your lab tests and imaging results in their entirety on your MyChart account.  Please discuss all results of fully with your primary care provider and other specialist at your follow-up visit.  Note: Portions of this text may have been transcribed using voice recognition software. Every effort was made to ensure accuracy; however, inadvertent computerized transcription errors may still be present.

## 2024-05-08 ENCOUNTER — Emergency Department (HOSPITAL_BASED_OUTPATIENT_CLINIC_OR_DEPARTMENT_OTHER)

## 2024-05-08 ENCOUNTER — Other Ambulatory Visit: Payer: Self-pay

## 2024-05-08 ENCOUNTER — Emergency Department (HOSPITAL_BASED_OUTPATIENT_CLINIC_OR_DEPARTMENT_OTHER)
Admission: EM | Admit: 2024-05-08 | Discharge: 2024-05-09 | Disposition: A | Discharge status: Home / Self Care | Attending: Emergency Medicine | Admitting: Emergency Medicine

## 2024-05-08 ENCOUNTER — Encounter (HOSPITAL_BASED_OUTPATIENT_CLINIC_OR_DEPARTMENT_OTHER): Payer: Self-pay

## 2024-05-08 DIAGNOSIS — S93492A Sprain of other ligament of left ankle, initial encounter: Secondary | ICD-10-CM | POA: Diagnosis not present

## 2024-05-08 DIAGNOSIS — S99912A Unspecified injury of left ankle, initial encounter: Secondary | ICD-10-CM | POA: Diagnosis present

## 2024-05-08 DIAGNOSIS — W1842XA Slipping, tripping and stumbling without falling due to stepping into hole or opening, initial encounter: Secondary | ICD-10-CM | POA: Insufficient documentation

## 2024-05-08 MED ORDER — IBUPROFEN 800 MG PO TABS
800.0000 mg | ORAL_TABLET | Freq: Once | ORAL | Status: AC
  Administered 2024-05-09: 800 mg via ORAL
  Filled 2024-05-08: qty 1

## 2024-05-08 NOTE — ED Provider Notes (Signed)
 [place] [facility] Provider Note   CSN: [id number] Arrival date & time: 05/08/24  2134     Patient presents with: Ankle Injury   Danielle Ortiz is a 23 y.o. female.   Presents for evaluation of ankle injury.  Patient stepped in a hole and twisted her ankle around 4 PM today.  She is having pain of the ankle especially with movement or bearing weight.       Prior to Admission medications   Medication Sig Start Date End Date Taking? Authorizing Provider  acetaminophen  (TYLENOL ) 325 MG tablet Take 2 tablets (650 mg total) by mouth every 4 (four) hours as needed (for pain scale < 4). 09/21/21   Danielle Domino, DO  cephALEXin  (KEFLEX ) 500 MG capsule Take 1 capsule (500 mg total) by mouth 4 (four) times daily. 10/04/23   Danielle Ortiz, Danielle HERO, PA-C  cyclobenzaprine  (FLEXERIL ) 10 MG tablet Take 1 tablet (10 mg total) by mouth 2 (two) times daily as needed for muscle spasms. 06/08/23   Danielle Ortiz, Danielle HERO, PA-C  ibuprofen  (ADVIL ) 600 MG tablet Take 1 tablet (600 mg total) by mouth every 6 (six) hours. 09/21/21   Danielle Domino, DO  oxyCODONE  (ROXICODONE ) 5 MG immediate release tablet Take 1 tablet (5 mg total) by mouth every 4 (four) hours as needed for severe pain. 09/21/21   Danielle Domino, DO    Allergies: Patient has no known allergies.    Review of Systems  Updated Vital Signs BP 111/86 (BP Location: Left Arm)   Pulse 62   Temp 97.8 F (36.6 C)   Resp 16   Ht 5' 4 (1.626 m)   Wt 59 kg   LMP 05/01/2024 (Exact Date)   SpO2 100%   BMI 22.31 kg/m   Physical Exam Vitals and nursing note reviewed.  Constitutional:      Appearance: Normal appearance.  HENT:     Head: Atraumatic.  Musculoskeletal:     Left ankle: Swelling and ecchymosis present. No deformity. Tenderness present over the ATF ligament. No base of 5th metatarsal or proximal fibula tenderness. Decreased range of motion. Anterior drawer test negative.     Left Achilles  Tendon: Normal.  Skin:    Findings: Bruising present.  Neurological:     Mental Status: She is alert.     Sensory: Sensation is intact.     Motor: Motor function is intact.     (all labs ordered are listed, but only abnormal results are displayed) Labs Reviewed - No data to display  EKG: None  Radiology: DG Ankle Complete Left Result Date: 05/08/2024 EXAM: 3 or more VIEW(S) XRAY OF THE LEFT ANKLE 05/08/2024 10:01:00 PM CLINICAL HISTORY: Danielle Ortiz. Pt states she was at the park and running after her 2 yr old and she stepped into a hole and twisted her left ankle. Pt shielded. COMPARISON: None available. FINDINGS: BONES AND JOINTS: No acute fracture. No focal osseous lesion. No joint dislocation. SOFT TISSUES: The soft tissues are unremarkable. IMPRESSION: 1. No acute osseous abnormality. Electronically signed by: Danielle Gatlin MD 05/08/2024 10:04 PM EDT RP Workstation: [id number]     Procedures   Medications Ordered in the ED  ibuprofen  (ADVIL ) tablet 800 mg (has no administration in time range)                                    Medical Decision Making Amount and/or Complexity  of Data Reviewed Radiology: ordered.   Presents with diffuse ankle swelling and tenderness/pain after twisting her ankle.  X-ray of ankle is negative.     Final diagnoses:  Sprain of anterior talofibular ligament of left ankle, initial encounter    ED Discharge Orders     None          Danielle Lonni PARAS, MD 05/08/24 2349

## 2024-05-08 NOTE — ED Triage Notes (Signed)
 Pt states she was at the park and fell /tripped in a hole C/o left ankle pain.   +swelling +bruising Tylenol  @2045
